# Patient Record
Sex: Male | Born: 1955 | Race: White | Hispanic: No | Marital: Married
Health system: Midwestern US, Academic
[De-identification: ages and names within clinical notes are randomized; demographics above are authoritative.]

---

## 2017-03-05 ENCOUNTER — Encounter: Admit: 2017-03-05 | Discharge: 2017-03-05 | Payer: 59

## 2017-03-05 MED ORDER — HYDROXYCHLOROQUINE 200 MG PO TAB
200 mg | ORAL_TABLET | Freq: Two times a day (BID) | ORAL | 1 refills | 90.00000 days | Status: DC
Start: 2017-03-05 — End: 2017-03-05

## 2017-03-05 MED ORDER — HYDROXYCHLOROQUINE 200 MG PO TAB
200 mg | ORAL_TABLET | Freq: Two times a day (BID) | ORAL | 1 refills | 90.00000 days | Status: DC
Start: 2017-03-05 — End: 2017-09-11

## 2017-04-16 ENCOUNTER — Ambulatory Visit: Admit: 2017-04-16 | Discharge: 2017-04-16 | Payer: 59

## 2017-04-16 ENCOUNTER — Encounter: Admit: 2017-04-16 | Discharge: 2017-04-16 | Payer: 59

## 2017-04-16 ENCOUNTER — Ambulatory Visit: Admit: 2017-04-16 | Discharge: 2017-04-17 | Payer: 59

## 2017-04-16 ENCOUNTER — Ambulatory Visit: Admit: 2017-04-16 | Discharge: 2017-04-16 | Payer: MEDICARE

## 2017-04-16 DIAGNOSIS — M797 Fibromyalgia: Principal | ICD-10-CM

## 2017-04-16 DIAGNOSIS — R200 Anesthesia of skin: ICD-10-CM

## 2017-04-16 DIAGNOSIS — M62838 Other muscle spasm: ICD-10-CM

## 2017-04-16 DIAGNOSIS — Z8719 Personal history of other diseases of the digestive system: ICD-10-CM

## 2017-04-16 DIAGNOSIS — K7469 Other cirrhosis of liver: ICD-10-CM

## 2017-04-16 DIAGNOSIS — K5792 Diverticulitis of intestine, part unspecified, without perforation or abscess without bleeding: ICD-10-CM

## 2017-04-16 DIAGNOSIS — M06079 Rheumatoid arthritis without rheumatoid factor, unspecified ankle and foot: Principal | ICD-10-CM

## 2017-04-16 DIAGNOSIS — M2550 Pain in unspecified joint: ICD-10-CM

## 2017-04-16 DIAGNOSIS — R202 Paresthesia of skin: ICD-10-CM

## 2017-04-16 DIAGNOSIS — R51 Headache: ICD-10-CM

## 2017-04-16 DIAGNOSIS — I10 Essential (primary) hypertension: ICD-10-CM

## 2017-04-16 LAB — 25-OH VITAMIN D (D2 + D3): Lab: 26 ng/mL — ABNORMAL LOW (ref 30–80)

## 2017-04-16 LAB — SED RATE: Lab: 2 mm/h (ref 0–20)

## 2017-04-16 LAB — C REACTIVE PROTEIN (CRP): Lab: 0 mg/dL (ref <1.0)

## 2017-04-16 LAB — CBC AND DIFF
Lab: 0 10*3/uL (ref 0–0.20)
Lab: 4.9 M/UL (ref 4.4–5.5)
Lab: 6.7 10*3/uL — ABNORMAL LOW (ref 4.5–11.0)

## 2017-04-16 LAB — COMPREHENSIVE METABOLIC PANEL
Lab: 0.5 mg/dL (ref 0.3–1.2)
Lab: 1 mg/dL — ABNORMAL HIGH (ref 0.4–1.24)
Lab: 112 U/L — ABNORMAL HIGH (ref >60)
Lab: 134 MMOL/L — ABNORMAL LOW (ref 137–147)
Lab: 37 U/L (ref 7–40)
Lab: 4.5 g/dL — ABNORMAL LOW (ref >60)
Lab: 4.8 MMOL/L (ref 3.5–5.1)
Lab: 43 U/L (ref 7–56)
Lab: 7.2 g/dL (ref 6.0–8.0)
Lab: 98 mg/dL (ref 70–100)
Lab: >60 mL/min (ref >60)

## 2017-04-16 LAB — IRON + BINDING CAPACITY + %SAT+ FERRITIN
Lab: 113 ug/dL (ref 50–185)
Lab: 208 ng/mL (ref 30–300)
Lab: 373 ug/dL (ref 270–380)

## 2017-04-16 LAB — VITAMIN B12: Lab: 419 pg/mL (ref 180–914)

## 2017-04-16 LAB — PROTIME INR (PT): Lab: 1 MMOL/L — ABNORMAL LOW (ref 0.8–1.2)

## 2017-04-16 LAB — URIC ACID: Lab: 4.1 mg/dL (ref 4.0–8.0)

## 2017-04-16 LAB — TSH WITH FREE T4 REFLEX: Lab: 2.2 uU/mL (ref 0.35–5.00)

## 2017-04-16 LAB — ALPHA FETO PROTEIN (AFP): Lab: 1.9 ng/mL — ABNORMAL LOW (ref 0.0–15.0)

## 2017-04-16 LAB — FOLATE, SERUM: Lab: 4.3 ng/mL (ref >3.9)

## 2017-04-16 LAB — PARATHYROID HORMONE: Lab: 48 pg/mL (ref 10–65)

## 2017-04-16 LAB — CCP IGG ANTIBODY

## 2017-04-17 LAB — ELECTROPHORESIS-SERUM PROTEIN
Lab: 11 % (ref 5–15)
Lab: 4.5 % (ref 2–6)
Lab: 6.7 g/dL (ref 6.0–8.0)
Lab: 60 % (ref 48–68)

## 2017-04-17 LAB — IMMUNOFIXATION, SERUM (IFES)

## 2017-04-17 LAB — ANTI-NUCLEAR ANTIBODY(ANA): Lab: <80 {titer} (ref <80)

## 2017-04-17 LAB — RHEUMATOID FACTOR (RF): Lab: <20 [IU]/mL (ref <24)

## 2017-04-19 ENCOUNTER — Encounter: Admit: 2017-04-19 | Discharge: 2017-04-19 | Payer: 59

## 2017-04-19 NOTE — Telephone Encounter
-----  Message from Reynolds. Selinda Eon, DO sent at 04/19/2017  9:02 AM CDT -----  Your labs and imaging have been reviewed.     1. The joint survey, which looked at various bones in your body, did not show any erosive changes. (Erosive changes can sometimes be seen in patients with RA). 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     2. All of you inflammatory markers, such as ESR and CRP were normal     3. Your rheumatoid markers were also normal.     4. The various vitamins and hormones we checked were all also normal, except for vitamin D.     5. You should start to take a daily Vit D3 supplement of 1,'000mg'$  daily.

## 2017-04-19 NOTE — Progress Notes
This patient had gastric sleeve in 2015. After repeat liver biopsy in 2017 Melissa advised that as long as he keeps his weight down he can have his PCP do enzymes yearly and only see us back if they rise or he gains weight.     Patients enzymes are up, please advise.  Shawn StallMichelle Briarrose Shor RN BSN

## 2017-04-19 NOTE — Telephone Encounter
Called pt and spoke to his wife. Pt had authorized us to discuss with her. Reviewed results and advised starting Vitamin D3 supplements of 1,000mg  daily. Wife verbalized understanding.

## 2017-04-27 ENCOUNTER — Encounter: Admit: 2017-04-27 | Discharge: 2017-04-27 | Payer: 59

## 2017-04-27 NOTE — Telephone Encounter
Called pt's wife Kendal HymenBonnie and went over Dr. Scarlette CalicoMoran's recommendations to hold off on methotrexate due to the labs not showing inflammation and to continue with Remicade as is. Can be re discussed at next visit. Kendal HymenBonnie verbalized understanding and denied questions at this time.

## 2017-04-27 NOTE — Telephone Encounter
Pt's wife called asking about initiating methotrexate. Wife stated it was discussed at last OV but that labs and imaging were required first. Labs and Joint Survery have finalized results that need review but the MRI isn't scheduled until 06/05/17. Wife also inquired about the possibility of having Remicade infusion dosage increased, also discussed at last OV.    Routing to Dr. Berneda RoseMoran for update.

## 2017-05-02 NOTE — Progress Notes
Benjamin Li, [name], Benjamin Li --> Benjamin Li, Benjamin Breisch, RN    We should see him and do a fibroscan.     Patient notified of lab results.   Patient verbalized understanding and scheduled for f/u and fibroscan.  Benjamin StallMichelle Gedalia Mcmillon RN BSN

## 2017-06-05 ENCOUNTER — Ambulatory Visit: Admit: 2017-06-05 | Discharge: 2017-06-05 | Payer: MEDICARE

## 2017-06-05 DIAGNOSIS — M06079 Rheumatoid arthritis without rheumatoid factor, unspecified ankle and foot: Principal | ICD-10-CM

## 2017-06-05 DIAGNOSIS — M2550 Pain in unspecified joint: ICD-10-CM

## 2017-06-05 LAB — POC CREATININE, RAD: Lab: 0.9 mg/dL (ref 0.4–1.24)

## 2017-06-05 MED ORDER — GADOBENATE DIMEGLUMINE 529 MG/ML (0.1MMOL/0.2ML) IV SOLN
16 mL | Freq: Once | INTRAVENOUS | 0 refills | Status: CP
Start: 2017-06-05 — End: ?
  Administered 2017-06-05: 15:00:00 16 mL via INTRAVENOUS

## 2017-06-11 ENCOUNTER — Encounter: Admit: 2017-06-11 | Discharge: 2017-06-11 | Payer: 59

## 2017-06-11 ENCOUNTER — Ambulatory Visit: Admit: 2017-06-11 | Discharge: 2017-06-12 | Payer: 59

## 2017-06-11 DIAGNOSIS — R51 Headache: ICD-10-CM

## 2017-06-11 DIAGNOSIS — K7581 Nonalcoholic steatohepatitis (NASH): Principal | ICD-10-CM

## 2017-06-11 DIAGNOSIS — M797 Fibromyalgia: Principal | ICD-10-CM

## 2017-06-11 DIAGNOSIS — K7469 Other cirrhosis of liver: ICD-10-CM

## 2017-06-11 DIAGNOSIS — I10 Essential (primary) hypertension: ICD-10-CM

## 2017-06-11 DIAGNOSIS — M62838 Other muscle spasm: ICD-10-CM

## 2017-06-11 DIAGNOSIS — K740 Hepatic fibrosis: ICD-10-CM

## 2017-06-11 DIAGNOSIS — K5792 Diverticulitis of intestine, part unspecified, without perforation or abscess without bleeding: ICD-10-CM

## 2017-06-11 NOTE — Procedures
Fibroscan Procedure Note    Date:  @DATE @  Operator:  Dorothey BasemanWinston Makel Mcmann, MD  Attending: Dorothey BasemanWinston Jenniferlynn Saad, MD    Observations  E (Elastacity) Median:  13.6  kPa  IQR / med.:  23%  CAP Median:  400 dB/m    Interpretation  Disease:  HBV/HCV/HCV-HIV/Cholestatic/NAFLD/Other  Fibrosis score correlation/Estimation:     HBV:    <6.0  F0-F1:  No to Minimal Fibrosis  6.0-8.9  F2:  Moderate Fibrosis  9.0-11.9 F3:  Advanced Fibrosis  >12.0  F4:  Cirrhosis    HCV  <7.0  F0-F1:  No to Minimal Fibrosis  7.0-9.4  F2:  Moderate Fibrosis  9.5-11.9 F3:  Advanced Fibrosis  >12  F4:  Cirrhosis    HCV-HIV  <7.0  F0-F1:  No to Minimal Fibrosis  7.0-10.9 (<10) F2:  Moderate Fibrosis  11.0-13.9 F3:  Advanced Fibrosis  >14  F4:  Cirrhosis    Cholestatic  <7.0  F0-F1:  No to Minimal Fibrosis  7.0-9.9 (>7.5) F2:  Moderate Fibrosis  10.0-16.9 F3:  Advanced Fibrosis  >17  F4:  Cirrhosis    NAFLD/NASH  <7.0  F0-F1:  No to Minimal Fibrosis  7.0-9.9 (>7.5) F2:  Moderate Fibrosis  10.0-13.9 F3:  Advanced Fibrosis  >14  F4:  Cirrhosis    Other  <6.0  F0-F1:  No to Minimal Fibrosis  6-8.9  F2:  Moderate Fibrosis  9.0-11.9 F3:  Advanced Fibrosis  >12  F4:  Cirrhosis      Steatosis score correlation/estimation:  238-258 S1: >11% Steatosis  259-289 S2: >34% Steatosis  >290  S3: >67% Steatosis      Recommendations  -Follow-up for patients with cirrhosis.     Fibroscan evaluations are estimates only and due to measurement variability/characteristics of elastography results should be interpreted with caution and clinical correlation is required.    I certify that I performed the interpretation of this study, including review of measurements and quality control measures.      Dorothey BasemanWinston Mazal Ebey, MD

## 2017-06-11 NOTE — Telephone Encounter
Call returned to patient wife.  Advised that the patient has possible cirrhosis and will require abdominal US every 6 months.  Patient wife would like to have this scheduled at [facility].  Shawn StallMichelle Neeti Knudtson Wnek RN BSN

## 2017-06-11 NOTE — Telephone Encounter
Please call pt's wife, Kendal HymenBonnie, as they have questions about today's office visit and diagnosis.

## 2017-06-11 NOTE — Progress Notes
Date of Service: 06/11/2017    Subjective:             Benjamin Li is a 61 y.o. male.    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  Patient has been doing well but continued to lose weight.  His liver enzyme was previously normal but has been elevated at this time.  Today we did a fibro-scan and it shows that his liver stiffness is 13.6 and his CAP score is 400.  My plan is to have him reengage with [facility] bariatric to discuss about the high protein diet.  We will have him take Glucerna 1 serving before bedtime.  I suspect that it is the excessive weight loss that results in the hepatic steatosis and worsening in liver fibrosis.  Will have labs every 3 months and follow-up with him in 6 months.     Review of Systems   Constitutional: Positive for chills, diaphoresis and fatigue.   Eyes: Positive for pain, redness and itching.   Gastrointestinal: Positive for abdominal pain and constipation.   Musculoskeletal: Positive for arthralgias, back pain, neck pain and neck stiffness.   Neurological: Positive for weakness, light-headedness, numbness and headaches.   All other systems reviewed and are negative.        Objective:         ??? amitriptyline (ELAVIL) 50 mg tablet TAKE 1 TAB BY MOUTH AT BEDTIME DAILY.   ??? fentaNYL (DURAGESIC) 50 mcg/hr patch Apply 1 Patch to top of skin as directed every 72 hours   ??? hydroxychloroquine (PLAQUENIL) 200 mg tablet Take 1 tablet by mouth twice daily. Take with food.   ??? losartan(+) (COZAAR) 100 mg tablet Take 50 mg by mouth daily.   ??? naloxegol (MOVANTIK) 12.5 mg tab Take 12.5 mg by mouth daily.   ??? omeprazole DR(+) (PRILOSEC) 20 mg capsule Take 20 mg by mouth daily.   ??? oxyCODONE SR (OXYCONTIN) 10 mg tablet Take 10 mg by mouth every 12 hours   ??? prednisone (DELTASONE) 5 mg tablet 15mg  daily for 1 days, 10mg  daily for 7 days, then 5mg  daily x 7 days ??? tiZANidine (ZANAFLEX) 4 mg tablet 2-4mg  in the evening PRN leg cramps   ??? traZODone (DESYREL) 100 mg tablet Take 200 mg by mouth at bedtime daily.     Vitals:    06/11/17 1014   BP: 131/84   Pulse: 82   Resp: 16   Temp: 36.8 ???C (98.2 ???F)   TempSrc: Oral   SpO2: 95%   Weight: 80.3 kg (177 lb)   Height: 177.8 cm (70)     Body mass index is 25.4 kg/m???.     Physical Exam         Assessment and Plan:  Patient has been doing well but continued to lose weight.  His liver enzyme was previously normal but has been elevated at this time.  Today we did a fibro-scan and it shows that his liver stiffness is 13.6 and his CAP score is 400.  My plan is to have him reengage with [facility] bariatric to discuss about the high protein diet.  We will have him take Glucerna 1 serving before bedtime.  I suspect that it is the excessive weight loss that results in the hepatic steatosis and worsening in liver fibrosis.  Will have labs every 3 months and follow-up with him in 6 months.

## 2017-06-12 ENCOUNTER — Encounter: Admit: 2017-06-12 | Discharge: 2017-06-12 | Payer: 59

## 2017-06-12 DIAGNOSIS — K740 Hepatic fibrosis: ICD-10-CM

## 2017-06-12 NOTE — Progress Notes
Called Scheduling at [facility] to set up US abdomen, no answer.  Faxed US order to [phone] for schedulers to set up.  We will follow up.

## 2017-06-13 ENCOUNTER — Encounter: Admit: 2017-06-13 | Discharge: 2017-06-13 | Payer: 59

## 2017-06-13 ENCOUNTER — Ambulatory Visit: Admit: 2017-06-13 | Discharge: 2017-06-14 | Payer: MEDICARE

## 2017-06-13 DIAGNOSIS — K5792 Diverticulitis of intestine, part unspecified, without perforation or abscess without bleeding: ICD-10-CM

## 2017-06-13 DIAGNOSIS — I10 Essential (primary) hypertension: ICD-10-CM

## 2017-06-13 DIAGNOSIS — R51 Headache: ICD-10-CM

## 2017-06-13 DIAGNOSIS — M797 Fibromyalgia: Principal | ICD-10-CM

## 2017-06-13 DIAGNOSIS — M62838 Other muscle spasm: ICD-10-CM

## 2017-06-14 DIAGNOSIS — M2550 Pain in unspecified joint: ICD-10-CM

## 2017-06-14 DIAGNOSIS — M0609 Rheumatoid arthritis without rheumatoid factor, multiple sites: Principal | ICD-10-CM

## 2017-06-14 DIAGNOSIS — K7469 Other cirrhosis of liver: ICD-10-CM

## 2017-06-14 DIAGNOSIS — M4726 Other spondylosis with radiculopathy, lumbar region: ICD-10-CM

## 2017-06-14 DIAGNOSIS — Z79899 Other long term (current) drug therapy: ICD-10-CM

## 2017-06-14 DIAGNOSIS — M47812 Spondylosis without myelopathy or radiculopathy, cervical region: Secondary | ICD-10-CM

## 2017-06-14 DIAGNOSIS — R200 Anesthesia of skin: ICD-10-CM

## 2017-06-14 DIAGNOSIS — K7581 Nonalcoholic steatohepatitis (NASH): ICD-10-CM

## 2017-06-15 NOTE — Progress Notes
Called Radiology at [facility], US done today 9/14.  Report isn't ready yet, will request next week.

## 2017-06-18 ENCOUNTER — Encounter: Admit: 2017-06-18 | Discharge: 2017-06-18 | Payer: 59

## 2017-06-18 NOTE — Telephone Encounter
Call received from patient wife, Benjamin Li, to discuss questions they have regarding new diagnosis of cirrhosis.  Advise Benjamin Li that cirrhosis is not a death sentence, that people can still live long lives with this diagnosis if they remain active and continue healthy living styles.   [name] of warning signs of decompensation and types/reason of monitoring.  Benjamin Li states that they were shocked with the news and are still working through it, advised Benjamin Li to call with any other questions or concerns.  Shawn Stall RN BSN

## 2017-06-19 ENCOUNTER — Encounter: Admit: 2017-06-19 | Discharge: 2017-06-19 | Payer: 59

## 2017-06-19 DIAGNOSIS — K740 Hepatic fibrosis: ICD-10-CM

## 2017-06-19 DIAGNOSIS — K7581 Nonalcoholic steatohepatitis (NASH): Principal | ICD-10-CM

## 2017-06-19 DIAGNOSIS — K7469 Other cirrhosis of liver: ICD-10-CM

## 2017-06-22 ENCOUNTER — Encounter: Admit: 2017-06-22 | Discharge: 2017-06-22 | Payer: 59

## 2017-06-22 NOTE — Telephone Encounter
Faxed Remicade infusion orders to [facility] at [phone] per representative at the hospital.

## 2017-06-25 ENCOUNTER — Encounter: Admit: 2017-06-25 | Discharge: 2017-06-25 | Payer: 59

## 2017-06-25 DIAGNOSIS — K7469 Other cirrhosis of liver: ICD-10-CM

## 2017-06-25 DIAGNOSIS — K7581 Nonalcoholic steatohepatitis (NASH): Principal | ICD-10-CM

## 2017-06-25 NOTE — Telephone Encounter
Plaquenil eye exam records received from [facility], exam done by Dr. Chilton Si. Pt was last seen 06/21/2017. Records normal. Records placed in [facility].

## 2017-06-25 NOTE — Telephone Encounter
Please call with recent ABD [place] results.

## 2017-06-26 NOTE — Progress Notes
Call returned to patient wife.  Patient wife notified of US results.   Patient wife verbalized understanding and would like repeat 6 month US completed at [facility].    RN BSN

## 2017-06-26 NOTE — Telephone Encounter
Call returned to patient wife.  Patient wife notified of [place] results.   Patient wife verbalized understanding and would like repeat 6 month [place] completed at [facility].  Shawn Stall RN BSN

## 2017-09-05 ENCOUNTER — Encounter: Admit: 2017-09-05 | Discharge: 2017-09-05 | Payer: 59

## 2017-09-05 NOTE — Telephone Encounter
US abdomen scheduled at [facility] on 12/03/17 at 0800.  Patient to npo after midnight. Order faxed to [phone].  Left appointment details on voicemail to notify patient.

## 2017-09-11 ENCOUNTER — Encounter: Admit: 2017-09-11 | Discharge: 2017-09-11 | Payer: 59

## 2017-09-11 ENCOUNTER — Ambulatory Visit: Admit: 2017-09-11 | Discharge: 2017-09-12 | Payer: MEDICARE

## 2017-09-11 DIAGNOSIS — I10 Essential (primary) hypertension: ICD-10-CM

## 2017-09-11 DIAGNOSIS — K5792 Diverticulitis of intestine, part unspecified, without perforation or abscess without bleeding: ICD-10-CM

## 2017-09-11 DIAGNOSIS — M797 Fibromyalgia: Principal | ICD-10-CM

## 2017-09-11 DIAGNOSIS — R51 Headache: ICD-10-CM

## 2017-09-11 DIAGNOSIS — M62838 Other muscle spasm: ICD-10-CM

## 2017-09-12 ENCOUNTER — Encounter: Admit: 2017-09-12 | Discharge: 2017-09-12 | Payer: 59

## 2017-09-12 DIAGNOSIS — M62838 Other muscle spasm: ICD-10-CM

## 2017-09-12 DIAGNOSIS — K7581 Nonalcoholic steatohepatitis (NASH): ICD-10-CM

## 2017-09-12 DIAGNOSIS — K5792 Diverticulitis of intestine, part unspecified, without perforation or abscess without bleeding: ICD-10-CM

## 2017-09-12 DIAGNOSIS — M797 Fibromyalgia: Principal | ICD-10-CM

## 2017-09-12 DIAGNOSIS — M0609 Rheumatoid arthritis without rheumatoid factor, multiple sites: Principal | ICD-10-CM

## 2017-09-12 DIAGNOSIS — M2550 Pain in unspecified joint: ICD-10-CM

## 2017-09-12 DIAGNOSIS — K7460 Unspecified cirrhosis of liver: Secondary | ICD-10-CM

## 2017-09-12 DIAGNOSIS — I10 Essential (primary) hypertension: ICD-10-CM

## 2017-09-12 DIAGNOSIS — R51 Headache: ICD-10-CM

## 2017-09-12 DIAGNOSIS — Z79899 Other long term (current) drug therapy: Secondary | ICD-10-CM

## 2017-09-12 NOTE — Telephone Encounter
Miranda, please fax the new order for: IV infliximab 10mg /kg q 4 weeks written on paper orders. Orders placed in your box. Pt requests that it is faxed to [facility]. Thank you.    Valetta Moleobyn , DO  Rheumatology fellow

## 2017-09-14 NOTE — Telephone Encounter
Faxed Remicade infusion orders to [facility] at [phone].

## 2017-09-17 ENCOUNTER — Encounter: Admit: 2017-09-17 | Discharge: 2017-09-17 | Payer: 59

## 2017-09-17 NOTE — Telephone Encounter
Pt's wife called asking about infusion orders. They were faxed on 09/14/17.    Called pt's wife and she wants them refaxed to a different number [phone]. Fax sent.

## 2017-10-08 ENCOUNTER — Encounter: Admit: 2017-10-08 | Discharge: 2017-10-08 | Payer: 59

## 2017-11-23 LAB — CBC AND DIFF

## 2017-11-23 LAB — COMPREHENSIVE METABOLIC PANEL

## 2017-12-04 ENCOUNTER — Encounter: Admit: 2017-12-04 | Discharge: 2017-12-04 | Payer: 59

## 2017-12-04 ENCOUNTER — Ambulatory Visit: Admit: 2017-12-04 | Discharge: 2017-12-05 | Payer: 59

## 2017-12-04 DIAGNOSIS — K5792 Diverticulitis of intestine, part unspecified, without perforation or abscess without bleeding: ICD-10-CM

## 2017-12-04 DIAGNOSIS — I10 Essential (primary) hypertension: ICD-10-CM

## 2017-12-04 DIAGNOSIS — M62838 Other muscle spasm: ICD-10-CM

## 2017-12-04 DIAGNOSIS — R51 Headache: ICD-10-CM

## 2017-12-04 DIAGNOSIS — M797 Fibromyalgia: Principal | ICD-10-CM

## 2017-12-05 ENCOUNTER — Encounter: Admit: 2017-12-05 | Discharge: 2017-12-05 | Payer: 59

## 2017-12-05 DIAGNOSIS — M0609 Rheumatoid arthritis without rheumatoid factor, multiple sites: Principal | ICD-10-CM

## 2017-12-05 DIAGNOSIS — Z79899 Other long term (current) drug therapy: ICD-10-CM

## 2017-12-05 DIAGNOSIS — K7581 Nonalcoholic steatohepatitis (NASH): Secondary | ICD-10-CM

## 2017-12-05 DIAGNOSIS — M47812 Spondylosis without myelopathy or radiculopathy, cervical region: ICD-10-CM

## 2017-12-05 DIAGNOSIS — M542 Cervicalgia: ICD-10-CM

## 2017-12-05 DIAGNOSIS — M797 Fibromyalgia: ICD-10-CM

## 2017-12-05 DIAGNOSIS — K7460 Unspecified cirrhosis of liver: ICD-10-CM

## 2017-12-05 DIAGNOSIS — M4722 Other spondylosis with radiculopathy, cervical region: ICD-10-CM

## 2017-12-05 DIAGNOSIS — M2550 Pain in unspecified joint: ICD-10-CM

## 2017-12-12 ENCOUNTER — Encounter: Admit: 2017-12-12 | Discharge: 2017-12-12 | Payer: 59

## 2017-12-12 DIAGNOSIS — K740 Hepatic fibrosis: ICD-10-CM

## 2017-12-12 DIAGNOSIS — K7581 Nonalcoholic steatohepatitis (NASH): Principal | ICD-10-CM

## 2017-12-21 LAB — COMPREHENSIVE METABOLIC PANEL

## 2017-12-21 LAB — CBC AND DIFF

## 2018-01-03 ENCOUNTER — Encounter: Admit: 2018-01-03 | Discharge: 2018-01-04 | Payer: 59

## 2018-01-03 ENCOUNTER — Encounter: Admit: 2018-01-03 | Discharge: 2018-01-03 | Payer: 59

## 2018-01-03 DIAGNOSIS — M62838 Other muscle spasm: ICD-10-CM

## 2018-01-03 DIAGNOSIS — I10 Essential (primary) hypertension: ICD-10-CM

## 2018-01-03 DIAGNOSIS — R51 Headache: ICD-10-CM

## 2018-01-03 DIAGNOSIS — M797 Fibromyalgia: Principal | ICD-10-CM

## 2018-01-03 DIAGNOSIS — K5792 Diverticulitis of intestine, part unspecified, without perforation or abscess without bleeding: ICD-10-CM

## 2018-01-04 ENCOUNTER — Encounter: Admit: 2018-01-04 | Discharge: 2018-01-04 | Payer: 59

## 2018-01-07 ENCOUNTER — Encounter: Admit: 2018-01-07 | Discharge: 2018-01-07 | Payer: 59

## 2018-01-07 DIAGNOSIS — K7581 Nonalcoholic steatohepatitis (NASH): Principal | ICD-10-CM

## 2018-01-07 DIAGNOSIS — K740 Hepatic fibrosis: ICD-10-CM

## 2018-01-08 ENCOUNTER — Encounter: Admit: 2018-01-08 | Discharge: 2018-01-08 | Payer: 59

## 2018-01-22 ENCOUNTER — Encounter: Admit: 2018-01-22 | Discharge: 2018-01-22 | Payer: 59

## 2018-01-22 DIAGNOSIS — I10 Essential (primary) hypertension: ICD-10-CM

## 2018-01-22 DIAGNOSIS — M797 Fibromyalgia: Principal | ICD-10-CM

## 2018-01-22 DIAGNOSIS — M4722 Other spondylosis with radiculopathy, cervical region: Secondary | ICD-10-CM

## 2018-01-22 DIAGNOSIS — R51 Headache: ICD-10-CM

## 2018-01-22 DIAGNOSIS — K5792 Diverticulitis of intestine, part unspecified, without perforation or abscess without bleeding: ICD-10-CM

## 2018-01-22 DIAGNOSIS — M62838 Other muscle spasm: ICD-10-CM

## 2018-01-22 MED ORDER — DICLOFENAC SODIUM 1 % TP GEL
2 g | Freq: Four times a day (QID) | TOPICAL | 3 refills | 19.00000 days | Status: AC
Start: 2018-01-22 — End: ?

## 2018-01-23 ENCOUNTER — Ambulatory Visit: Admit: 2018-01-22 | Discharge: 2018-01-23 | Payer: MEDICARE

## 2018-01-23 DIAGNOSIS — M797 Fibromyalgia: ICD-10-CM

## 2018-01-23 DIAGNOSIS — M4802 Spinal stenosis, cervical region: ICD-10-CM

## 2018-01-23 DIAGNOSIS — K7581 Nonalcoholic steatohepatitis (NASH): ICD-10-CM

## 2018-01-23 DIAGNOSIS — M0609 Rheumatoid arthritis without rheumatoid factor, multiple sites: ICD-10-CM

## 2018-01-23 DIAGNOSIS — M542 Cervicalgia: Principal | ICD-10-CM

## 2018-01-23 DIAGNOSIS — M5030 Other cervical disc degeneration, unspecified cervical region: ICD-10-CM

## 2018-01-23 DIAGNOSIS — M2550 Pain in unspecified joint: ICD-10-CM

## 2018-01-23 DIAGNOSIS — M47812 Spondylosis without myelopathy or radiculopathy, cervical region: ICD-10-CM

## 2018-01-23 DIAGNOSIS — Z79899 Other long term (current) drug therapy: ICD-10-CM

## 2018-01-23 DIAGNOSIS — K7460 Unspecified cirrhosis of liver: ICD-10-CM

## 2018-02-08 ENCOUNTER — Encounter: Admit: 2018-02-08 | Discharge: 2018-02-08 | Payer: 59

## 2018-02-19 ENCOUNTER — Encounter: Admit: 2018-02-19 | Discharge: 2018-02-19 | Payer: 59

## 2018-02-27 ENCOUNTER — Encounter: Admit: 2018-02-27 | Discharge: 2018-02-27 | Payer: 59

## 2018-07-01 ENCOUNTER — Ambulatory Visit: Admit: 2018-07-01 | Discharge: 2018-07-02 | Payer: 59

## 2018-07-01 ENCOUNTER — Encounter: Admit: 2018-07-01 | Discharge: 2018-07-01 | Payer: 59

## 2018-07-01 DIAGNOSIS — M797 Fibromyalgia: Principal | ICD-10-CM

## 2018-07-01 DIAGNOSIS — Z79899 Other long term (current) drug therapy: ICD-10-CM

## 2018-07-01 DIAGNOSIS — K5792 Diverticulitis of intestine, part unspecified, without perforation or abscess without bleeding: ICD-10-CM

## 2018-07-01 DIAGNOSIS — G7119 Other specified myotonic disorders: ICD-10-CM

## 2018-07-01 DIAGNOSIS — R51 Headache: ICD-10-CM

## 2018-07-01 DIAGNOSIS — I10 Essential (primary) hypertension: ICD-10-CM

## 2018-07-01 DIAGNOSIS — M0609 Rheumatoid arthritis without rheumatoid factor, multiple sites: Principal | ICD-10-CM

## 2018-07-01 DIAGNOSIS — K7460 Unspecified cirrhosis of liver: ICD-10-CM

## 2018-07-01 DIAGNOSIS — M47812 Spondylosis without myelopathy or radiculopathy, cervical region: ICD-10-CM

## 2018-07-01 DIAGNOSIS — M2550 Pain in unspecified joint: ICD-10-CM

## 2018-07-01 DIAGNOSIS — M5412 Radiculopathy, cervical region: ICD-10-CM

## 2018-07-01 DIAGNOSIS — M62838 Other muscle spasm: ICD-10-CM

## 2018-07-05 ENCOUNTER — Encounter: Admit: 2018-07-05 | Discharge: 2018-07-05 | Payer: 59

## 2018-07-05 DIAGNOSIS — M797 Fibromyalgia: Principal | ICD-10-CM

## 2018-07-05 DIAGNOSIS — M62838 Other muscle spasm: ICD-10-CM

## 2018-07-05 DIAGNOSIS — R51 Headache: ICD-10-CM

## 2018-07-05 DIAGNOSIS — I10 Essential (primary) hypertension: ICD-10-CM

## 2018-07-05 DIAGNOSIS — K5792 Diverticulitis of intestine, part unspecified, without perforation or abscess without bleeding: ICD-10-CM

## 2018-07-15 ENCOUNTER — Encounter: Admit: 2018-07-15 | Discharge: 2018-07-15 | Payer: 59

## 2018-07-16 ENCOUNTER — Encounter: Admit: 2018-07-16 | Discharge: 2018-07-16 | Payer: 59

## 2018-11-11 ENCOUNTER — Encounter: Admit: 2018-11-11 | Discharge: 2018-11-11 | Payer: 59

## 2018-11-11 ENCOUNTER — Ambulatory Visit: Admit: 2018-11-11 | Discharge: 2018-11-12 | Payer: MEDICARE

## 2018-11-11 DIAGNOSIS — M797 Fibromyalgia: Secondary | ICD-10-CM

## 2018-11-11 DIAGNOSIS — G7119 Other specified myotonic disorders: Secondary | ICD-10-CM

## 2018-11-11 DIAGNOSIS — K5792 Diverticulitis of intestine, part unspecified, without perforation or abscess without bleeding: ICD-10-CM

## 2018-11-11 DIAGNOSIS — R51 Headache: ICD-10-CM

## 2018-11-11 DIAGNOSIS — M0600 Rheumatoid arthritis without rheumatoid factor, unspecified site: Principal | ICD-10-CM

## 2018-11-11 DIAGNOSIS — M62838 Other muscle spasm: ICD-10-CM

## 2018-11-11 DIAGNOSIS — I10 Essential (primary) hypertension: ICD-10-CM

## 2018-11-11 NOTE — Patient Instructions
In some patients, this medicine may cause a serious brain infection that may cause death. If you have any problems seeing, thinking, speaking, walking, or standing, tell your healthcare professional right away. If you cannot reach your healthcare professional, urgently seek other source of medical care.  Call your doctor or health care professional for advice if you get a fever, chills or sore throat, or other symptoms of a cold or flu. Do not treat yourself. This drug decreases your body's ability to fight infections. Try to avoid being around people who are sick.  Do not become pregnant while taking this medicine or for 12 months after stopping it. Women should inform their doctor if they wish to become pregnant or think they might be pregnant. There is a potential for serious side effects to an unborn child. Talk to your health care professional or pharmacist for more information. Do not breast-feed an infant while taking this medicine or for 6 months after stopping it.  NOTE:This sheet is a summary. It may not cover all possible information. If you have questions about this medicine, talk to your doctor, pharmacist, or health care provider. Copyright??? 2019 [employer]

## 2018-11-12 ENCOUNTER — Encounter: Admit: 2018-11-12 | Discharge: 2018-11-12 | Payer: 59

## 2018-11-12 DIAGNOSIS — Z79899 Other long term (current) drug therapy: ICD-10-CM

## 2018-11-12 DIAGNOSIS — M2550 Pain in unspecified joint: Secondary | ICD-10-CM

## 2018-11-12 DIAGNOSIS — K7460 Unspecified cirrhosis of liver: ICD-10-CM

## 2018-11-12 DIAGNOSIS — M0609 Rheumatoid arthritis without rheumatoid factor, multiple sites: Principal | ICD-10-CM

## 2018-11-15 LAB — CBC AND DIFF

## 2018-11-20 ENCOUNTER — Encounter: Admit: 2018-11-20 | Discharge: 2018-11-20 | Payer: 59

## 2018-11-28 ENCOUNTER — Encounter: Admit: 2018-11-28 | Discharge: 2018-11-28 | Payer: 59

## 2018-11-28 NOTE — Telephone Encounter
Rec'd fax from Ortho. Routing to Dr. Berneda Rose to review and make recommendations. Sounds like we can write a letter of clearance and give them recommendations for the medications.

## 2018-12-10 NOTE — Telephone Encounter
Michelle with [facility] called and lvm that she would like [place] to refax the orders for Rituxan to [phone].    Faxing orders to [phone].     Called Michelle at [phone] and lvm on infusion vm that we have faxed the orders and to call back with any questions.

## 2018-12-30 LAB — COMPREHENSIVE METABOLIC PANEL

## 2018-12-30 LAB — CBC AND DIFF

## 2019-02-27 ENCOUNTER — Encounter: Admit: 2019-02-27 | Discharge: 2019-02-27 | Payer: 59

## 2019-04-30 ENCOUNTER — Encounter: Admit: 2019-04-30 | Discharge: 2019-04-30

## 2019-04-30 ENCOUNTER — Ambulatory Visit: Admit: 2019-04-30 | Discharge: 2019-05-01

## 2019-04-30 DIAGNOSIS — I10 Essential (primary) hypertension: Secondary | ICD-10-CM

## 2019-04-30 DIAGNOSIS — M62838 Other muscle spasm: Secondary | ICD-10-CM

## 2019-04-30 DIAGNOSIS — D899 Disorder involving the immune mechanism, unspecified: Secondary | ICD-10-CM

## 2019-04-30 DIAGNOSIS — M47812 Spondylosis without myelopathy or radiculopathy, cervical region: Secondary | ICD-10-CM

## 2019-04-30 DIAGNOSIS — M0600 Rheumatoid arthritis without rheumatoid factor, unspecified site: Principal | ICD-10-CM

## 2019-04-30 DIAGNOSIS — K7460 Unspecified cirrhosis of liver: Secondary | ICD-10-CM

## 2019-04-30 DIAGNOSIS — K5792 Diverticulitis of intestine, part unspecified, without perforation or abscess without bleeding: Secondary | ICD-10-CM

## 2019-04-30 DIAGNOSIS — R51 Headache: Secondary | ICD-10-CM

## 2019-04-30 DIAGNOSIS — M5137 Other intervertebral disc degeneration, lumbosacral region: Secondary | ICD-10-CM

## 2019-04-30 DIAGNOSIS — Z5181 Encounter for therapeutic drug level monitoring: Secondary | ICD-10-CM

## 2019-04-30 DIAGNOSIS — M797 Fibromyalgia: Secondary | ICD-10-CM

## 2019-05-01 ENCOUNTER — Encounter: Admit: 2019-05-01 | Discharge: 2019-05-01

## 2019-05-01 DIAGNOSIS — G7119 Other specified myotonic disorders: Secondary | ICD-10-CM

## 2019-05-01 DIAGNOSIS — M2550 Pain in unspecified joint: Secondary | ICD-10-CM

## 2019-05-01 DIAGNOSIS — Z79899 Other long term (current) drug therapy: Secondary | ICD-10-CM

## 2019-05-01 DIAGNOSIS — M0600 Rheumatoid arthritis without rheumatoid factor, unspecified site: Secondary | ICD-10-CM

## 2019-05-01 NOTE — Telephone Encounter
Per Dr. Constance Holster to add on labs to Rituxan infusion orders (ESR, CRP and IgG level).    Infusion orders completed with added labs. Will fax orders to [facility] at [phone] once reviewed and signed off by Dr. Constance Holster.

## 2019-05-02 NOTE — Telephone Encounter
Faxing updated infusion orders via epic to [facility] at [phone].

## 2019-05-02 NOTE — Telephone Encounter
Rituxan infusion order signed.

## 2019-05-15 NOTE — Telephone Encounter
[facility] at [phone] and they did receive the orders faxed for Rituxan.     Pt is due on 09/16 for next infusion if they need anything else from [place] they will call us.

## 2019-05-16 NOTE — Telephone Encounter
[name] with [facility] returned call and lvm that they have the orders and everything they need. Benjamin Li states the pt is scheduled for next infusion on 09/16 and 09/30. Benjamin Li had no further questions at this time.

## 2019-05-16 NOTE — Telephone Encounter
[name] with [facility] infusion called and lvm to return call at [phone]. Called [place] and lvm for a return call at the number she provided.

## 2019-06-11 ENCOUNTER — Encounter: Admit: 2019-06-11 | Discharge: 2019-06-11

## 2019-06-11 NOTE — Telephone Encounter
Benjamin Li with [facility] infusion called and lvm that there is an issue with the pts infusion. Called Benjamin at [phone] and lvm for a return call to discuss concerns.

## 2019-06-13 NOTE — Telephone Encounter
Benjamin Li with [facility] infusion center returned call and has some concerns she would like to discuss regarding the pts upcoming Rituxan next week. Called Benjamin at [phone] and she states that they are not longer allowed to administer the Rituxan at their facility due to not having a chemo hood to be able to mix the medication properly. She states the pt will need to be rescheduled to have this done at an alternative infusion unit. Benjamin also stated she would like to know if this RN could call and communicate with Dr. Rolly Salter (PCP) nurse Azucena Kuba as she has also been working on this as well. [name] at [phone] and notified her that we will bring the pt back to [place] for the infusions and will notify the pt. [name] states understanding.    Called pt and lvm for a return call to discuss the need to come back to [place] for infusions.     Routing to Dr. Constance Holster to review and reorder Rituxan at [facility] so infusion team can start working on pre cert and coding to get him scheduled. Not sure if we should mark the referral as urgent since the pt was due on 09/16 for his infusion.

## 2019-06-15 MED ORDER — ACETAMINOPHEN 500 MG PO TAB
500 mg | Freq: Once | ORAL | 0 refills | Status: CN
Start: 2019-06-15 — End: ?

## 2019-06-15 MED ORDER — RITUXIMAB IVPB
1000 mg | Freq: Once | INTRAVENOUS | 0 refills | Status: CN
Start: 2019-06-15 — End: ?

## 2019-06-15 MED ORDER — METHYLPREDNISOLONE SOD SUC(PF) 125 MG/2 ML IJ SOLR
125 mg | Freq: Once | INTRAVENOUS | 0 refills | Status: CN
Start: 2019-06-15 — End: ?

## 2019-06-15 MED ORDER — DIPHENHYDRAMINE HCL 25 MG PO CAP
25 mg | Freq: Once | ORAL | 0 refills | Status: CN
Start: 2019-06-15 — End: ?

## 2019-06-15 NOTE — Telephone Encounter
Mr Jachim Rituximab infusions has been reordered to be done at the [place] infusion center.    Thanks  Dr Constance Holster

## 2019-07-08 LAB — HEPATITIS B SURFACE AG

## 2019-07-15 ENCOUNTER — Encounter: Admit: 2019-07-15 | Discharge: 2019-07-15 | Payer: MEDICARE

## 2019-07-15 DIAGNOSIS — Z5181 Encounter for therapeutic drug level monitoring: Secondary | ICD-10-CM

## 2019-07-15 DIAGNOSIS — M0600 Rheumatoid arthritis without rheumatoid factor, unspecified site: Secondary | ICD-10-CM

## 2019-07-15 NOTE — Progress Notes
Your kidney function and liver function are normal. Blood counts are stable. Inflammatory markers normal. Hepatitis testing was negative. Please continue rituximab therapy  Dr Constance Holster

## 2019-07-29 ENCOUNTER — Encounter: Admit: 2019-07-29 | Discharge: 2019-07-29 | Payer: MEDICARE

## 2019-10-16 IMAGING — CR CHEST
2 series · 2 of 2 positions shown · non-contrast
Comparison: none

[chest pa]
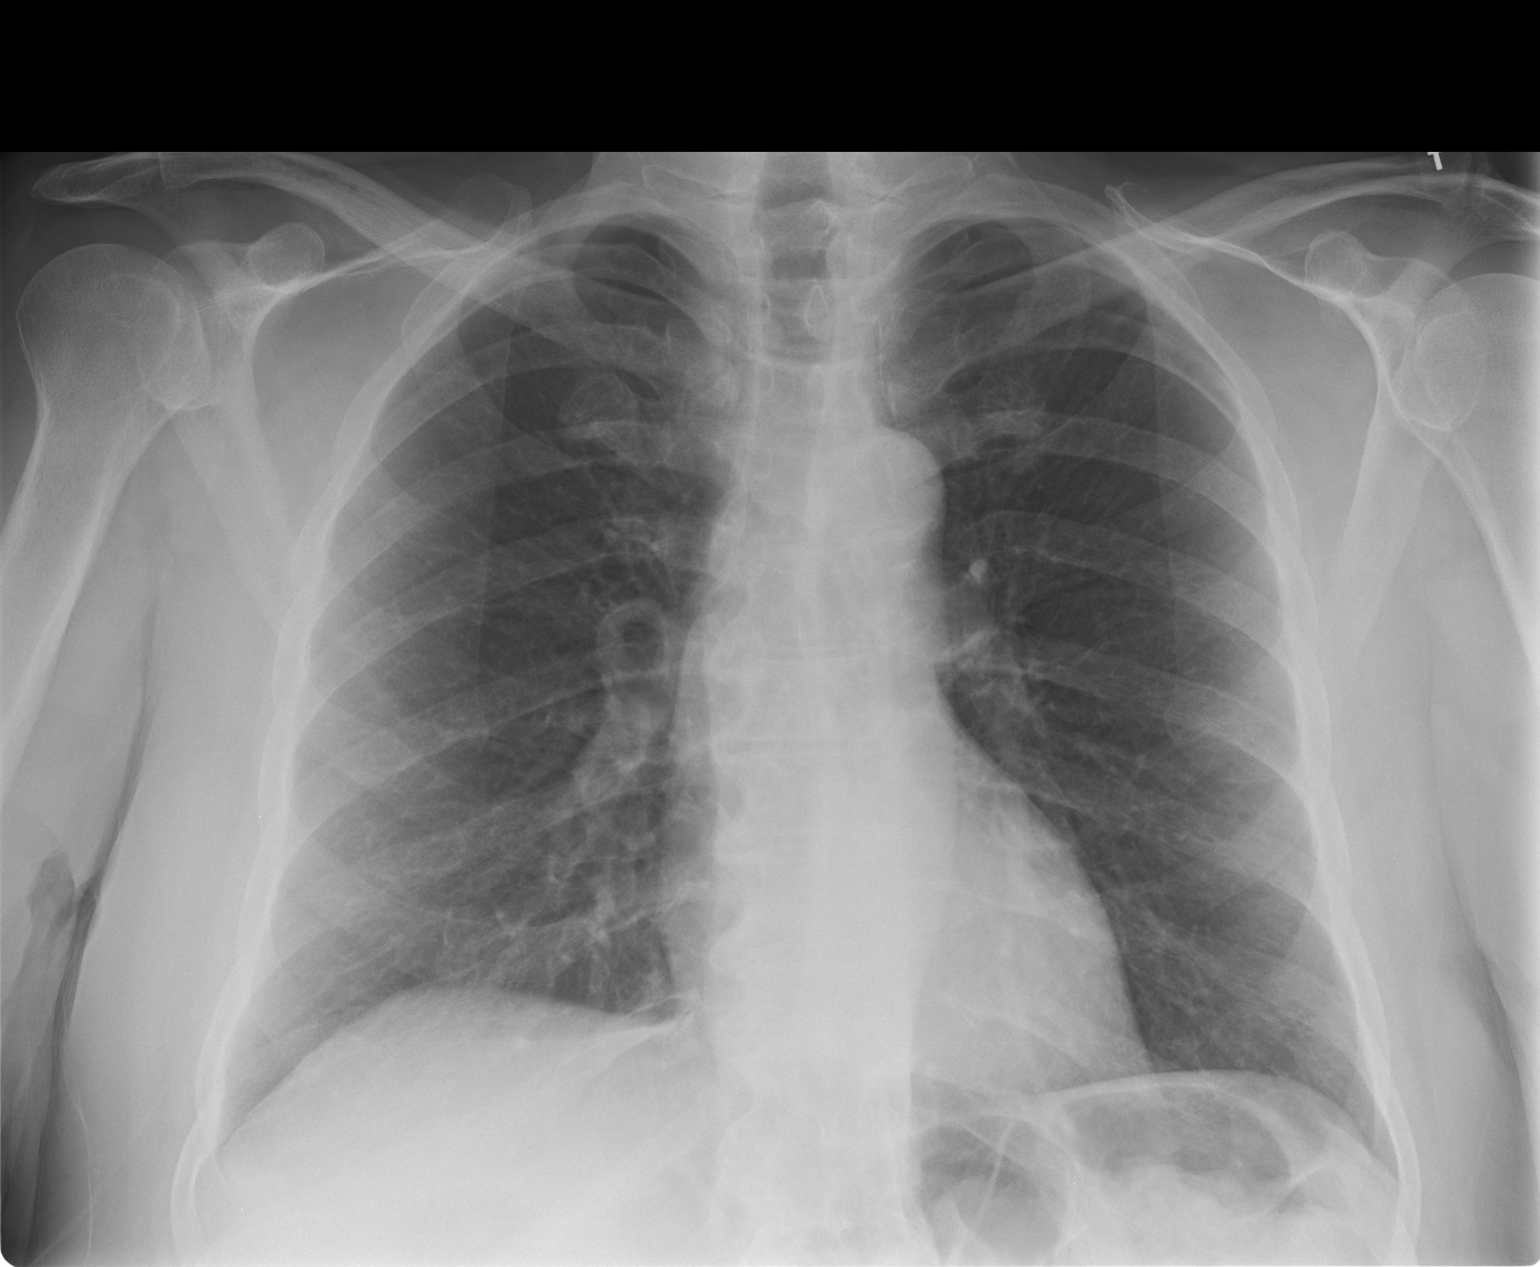

[chest lat]
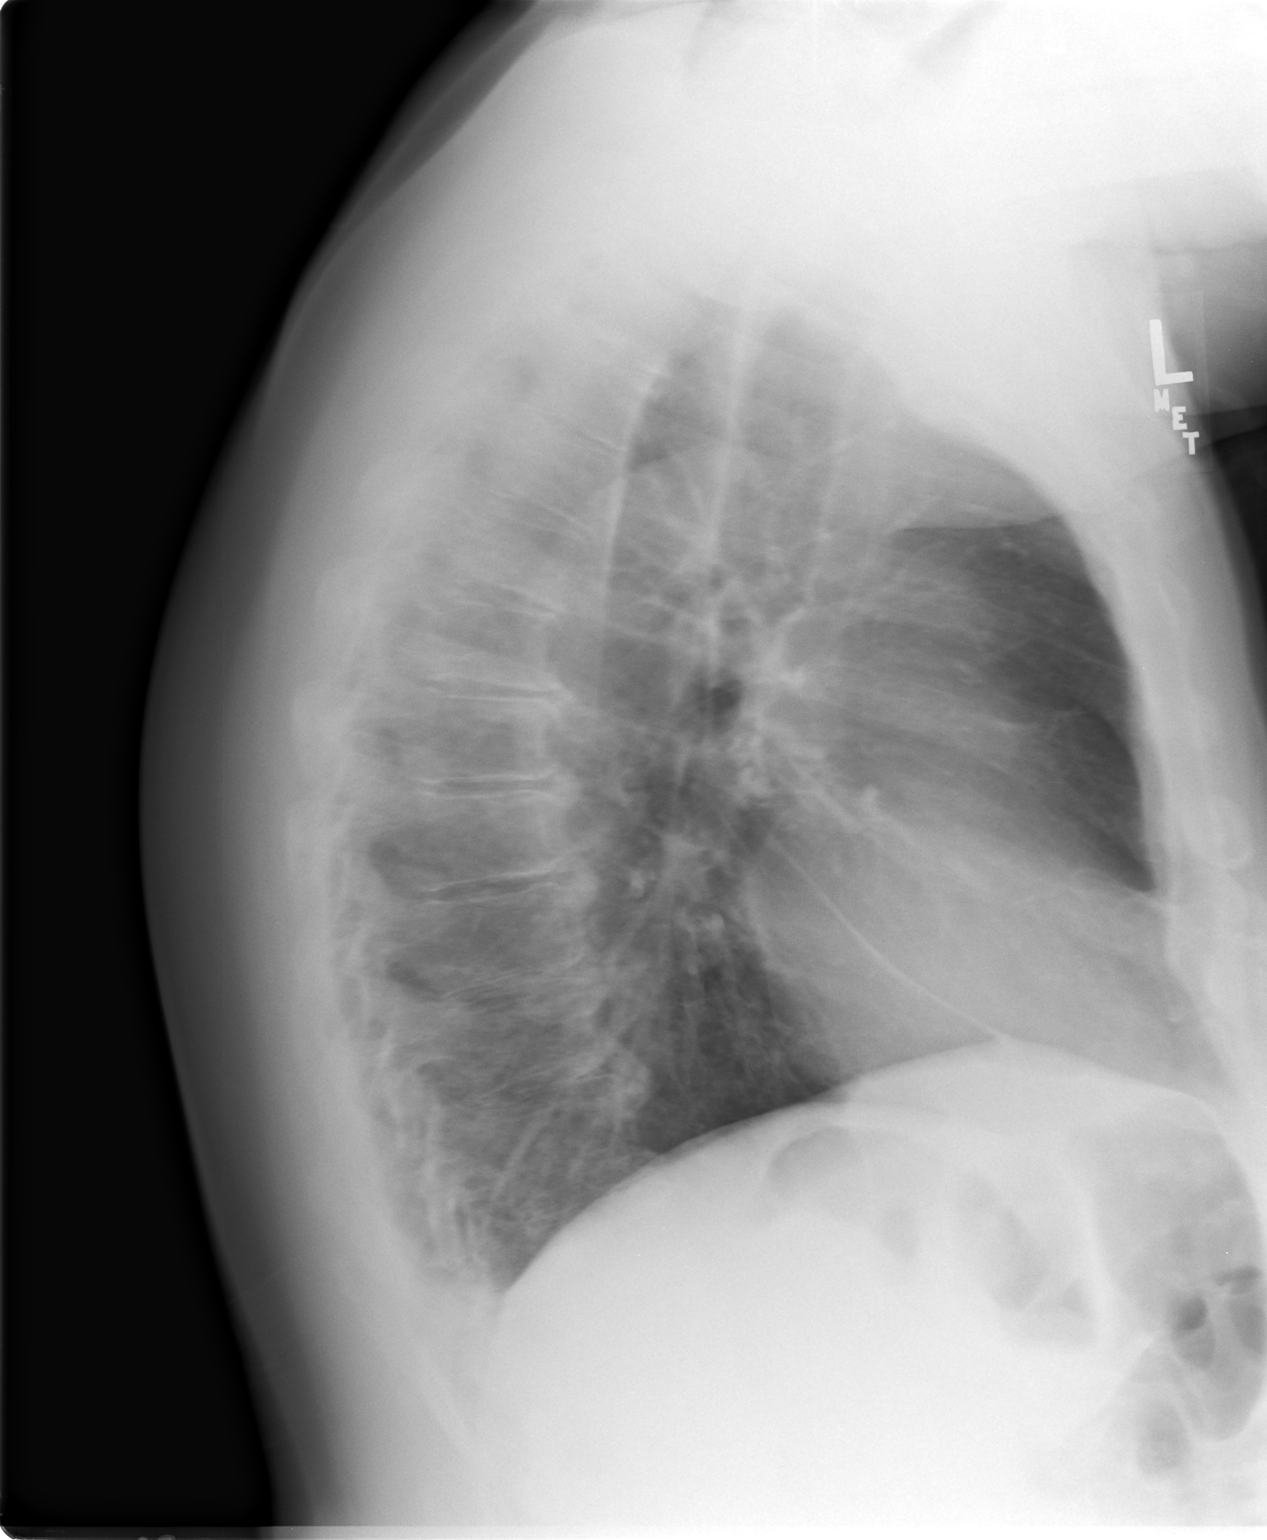

[2 of 2 positions shown; findings below may reference images not displayed]

DIAGNOSTIC STUDIES

EXAM

Chest radiographs.

INDICATION

fever, cough, R flank/abd pain, pleuritic pain
PT. STATES COUGH, MUCOUS X GREEN/YELLOW , FEVER.MT

TECHNIQUE

PA and lateral chest views.

COMPARISONS

None.

FINDINGS

The lungs are clear without consolidative airspace disease, pleural effusion, or pneumothorax. The
cardiomediastinal silhouette is not widened. Thoracic spondylosis is present.

IMPRESSION

No acute cardiopulmonary pathology.

## 2019-11-04 ENCOUNTER — Encounter: Admit: 2019-11-04 | Discharge: 2019-11-04 | Payer: MEDICARE

## 2019-12-15 ENCOUNTER — Encounter: Admit: 2019-12-15 | Discharge: 2019-12-15 | Payer: MEDICARE

## 2019-12-15 ENCOUNTER — Ambulatory Visit: Admit: 2019-12-15 | Discharge: 2019-12-15 | Payer: MEDICARE

## 2019-12-15 DIAGNOSIS — M62838 Other muscle spasm: Secondary | ICD-10-CM

## 2019-12-15 DIAGNOSIS — K7460 Unspecified cirrhosis of liver: Secondary | ICD-10-CM

## 2019-12-15 DIAGNOSIS — M0600 Rheumatoid arthritis without rheumatoid factor, unspecified site: Secondary | ICD-10-CM

## 2019-12-15 DIAGNOSIS — M797 Fibromyalgia: Secondary | ICD-10-CM

## 2019-12-15 DIAGNOSIS — K5792 Diverticulitis of intestine, part unspecified, without perforation or abscess without bleeding: Secondary | ICD-10-CM

## 2019-12-15 DIAGNOSIS — I10 Essential (primary) hypertension: Secondary | ICD-10-CM

## 2019-12-15 DIAGNOSIS — D849 Immunodeficiency, unspecified: Secondary | ICD-10-CM

## 2019-12-15 DIAGNOSIS — M2550 Pain in unspecified joint: Secondary | ICD-10-CM

## 2019-12-15 DIAGNOSIS — Z5181 Encounter for therapeutic drug level monitoring: Secondary | ICD-10-CM

## 2019-12-15 DIAGNOSIS — R519 Generalized headaches: Secondary | ICD-10-CM

## 2019-12-15 LAB — CBC AND DIFF
Lab: 0.1 10*3/uL (ref 0–0.20)
Lab: 0.1 10*3/uL (ref 0–0.45)
Lab: 0.4 10*3/uL (ref 0–0.80)
Lab: 1.5 10*3/uL (ref 1.0–4.8)
Lab: 13 % (ref 11–15)
Lab: 14 g/dL (ref 13.5–16.5)
Lab: 2 % (ref >60)
Lab: 23 % — ABNORMAL LOW (ref 24–44)
Lab: 246 10*3/uL (ref 150–400)
Lab: 28 pg (ref 26–34)
Lab: 3 % (ref >60)
Lab: 4.4 10*3/uL (ref 1.8–7.0)
Lab: 4.9 M/UL — ABNORMAL HIGH (ref 4.4–5.5)
Lab: 41 % (ref 40–50)
Lab: 6.8 K/UL (ref 4.5–11.0)
Lab: 65 % — ABNORMAL HIGH (ref 41–77)
Lab: 7 % (ref 4–12)

## 2019-12-15 LAB — SED RATE: Lab: 2 mm/h (ref 0–20)

## 2019-12-15 LAB — HEPATITIS C ANTIBODY W REFLEX HCV PCR QUANT

## 2019-12-15 LAB — C REACTIVE PROTEIN (CRP): Lab: 0.1 mg/dL (ref <1.0)

## 2019-12-15 LAB — COMPREHENSIVE METABOLIC PANEL
Lab: 138 MMOL/L (ref 137–147)
Lab: 4.6 MMOL/L (ref 3.5–5.1)

## 2019-12-15 LAB — HEPATITIS B SURFACE AB: Lab: NEGATIVE % — ABNORMAL LOW (ref 24–44)

## 2019-12-15 MED ORDER — RXAMB AMITR/GABAPEN/EMU OIL 4/4/10% CREAM (COMPOUND)
Freq: Three times a day (TID) | TOPICAL | 1 refills | Status: CN | PRN
Start: 2019-12-15 — End: ?
  Filled 2019-12-15: qty 300, 15d supply, fill #1

## 2019-12-16 ENCOUNTER — Encounter: Admit: 2019-12-16 | Discharge: 2019-12-16 | Payer: MEDICARE

## 2019-12-16 NOTE — Progress Notes
Hepatitis C antibodies are negative. You do not have immunity to hepatitis B. Please ask your primary doctor or the health department regarding hepatitis B vaccination. Inflammatory markers are not elevated. Kidney and liver function is normal. Blood counts are stable. Please let me know if there are any questions.   Dr Pollyann Kennedy

## 2019-12-17 ENCOUNTER — Encounter: Admit: 2019-12-17 | Discharge: 2019-12-17 | Payer: MEDICARE

## 2020-01-07 ENCOUNTER — Encounter: Admit: 2020-01-07 | Discharge: 2020-01-07 | Payer: MEDICARE

## 2020-05-10 ENCOUNTER — Ambulatory Visit: Admit: 2020-05-10 | Discharge: 2020-05-10 | Payer: MEDICARE

## 2020-05-10 ENCOUNTER — Encounter: Admit: 2020-05-10 | Discharge: 2020-05-10 | Payer: MEDICARE

## 2020-05-10 DIAGNOSIS — Z5181 Encounter for therapeutic drug level monitoring: Secondary | ICD-10-CM

## 2020-05-10 DIAGNOSIS — M0600 Rheumatoid arthritis without rheumatoid factor, unspecified site: Secondary | ICD-10-CM

## 2020-05-10 DIAGNOSIS — M62838 Other muscle spasm: Secondary | ICD-10-CM

## 2020-05-10 DIAGNOSIS — D849 Immunodeficiency, unspecified: Secondary | ICD-10-CM

## 2020-05-10 DIAGNOSIS — M797 Fibromyalgia: Secondary | ICD-10-CM

## 2020-05-10 DIAGNOSIS — K7460 Unspecified cirrhosis of liver: Secondary | ICD-10-CM

## 2020-05-10 DIAGNOSIS — I10 Essential (primary) hypertension: Secondary | ICD-10-CM

## 2020-05-10 DIAGNOSIS — K5792 Diverticulitis of intestine, part unspecified, without perforation or abscess without bleeding: Secondary | ICD-10-CM

## 2020-05-10 DIAGNOSIS — R519 Generalized headaches: Secondary | ICD-10-CM

## 2020-05-10 DIAGNOSIS — G7119 Other specified myotonic disorders: Secondary | ICD-10-CM

## 2020-05-10 DIAGNOSIS — M2550 Pain in unspecified joint: Secondary | ICD-10-CM

## 2020-05-10 LAB — COMPREHENSIVE METABOLIC PANEL
Lab: 102 mg/dL — ABNORMAL HIGH (ref 70–100)
Lab: 103 MMOL/L (ref 98–110)
Lab: 140 MMOL/L (ref 137–147)
Lab: 15 mg/dL (ref 7–25)
Lab: 4.6 MMOL/L (ref 3.5–5.1)
Lab: 9.5 mg/dL (ref 8.5–10.6)

## 2020-05-10 LAB — IMMUNOGLOBULINS-IGA,IGG,IGM
Lab: 295 mg/dL (ref 70–390)
Lab: 49 mg/dL (ref 38–328)
Lab: 743 mg/dL — ABNORMAL LOW (ref 762–1488)

## 2020-05-10 LAB — CBC
Lab: 12 g/dL — ABNORMAL LOW (ref >60)
Lab: 14 % (ref 11–15)
Lab: 232 K/UL (ref 150–400)
Lab: 28 pg (ref 26–34)
Lab: 33 g/dL (ref 32.0–36.0)
Lab: 37 % — ABNORMAL LOW (ref >60)
Lab: 4.4 M/UL — ABNORMAL HIGH (ref <100)
Lab: 5.9 K/UL (ref >40)

## 2020-05-10 LAB — C REACTIVE PROTEIN (CRP): Lab: 0.6 mg/dL (ref <1.0)

## 2020-05-10 LAB — TSH WITH FREE T4 REFLEX: Lab: 1.2 uU/mL (ref 0.35–5.00)

## 2020-05-10 LAB — VITAMIN B12: Lab: 242 pg/mL (ref 180–914)

## 2020-05-10 LAB — 25-OH VITAMIN D (D2 + D3): Lab: 58 ng/mL (ref 30–80)

## 2020-05-10 LAB — SED RATE: Lab: 7 mm/h (ref 0–20)

## 2020-05-10 MED ORDER — CYANOCOBALAMIN (VITAMIN B-12) 1,000 MCG PO TAB
1000 ug | ORAL_TABLET | Freq: Every day | ORAL | 0 refills | 29.00000 days | Status: AC
Start: 2020-05-10 — End: ?

## 2020-05-10 NOTE — Progress Notes
Rheumatology Follow Up Visit   Benjamin Li is a 64 y.o. male who presents today for a follow up visit for seronegative rheumatoid arthritis. He also has a history of cirrhosis secondary to NASH, diverticulitis, gastric sleeve, neuromyotonia (Isaac's disease), and hypertension.    BRIEF SUMMARY:   Benjamin Li was referred to [facility] rheumatology in 2014 for concern for rheumatoid arthritis by his neurologist, Dr Patrcia Dolly. He presented with swelling of the hands, wrists, and knees. He was diagnosed as seronegative non erosive rheumatoid arthritis based on MRI findings 08/2012 of synovitis of the left wrist and hand as well as based on activite synovitis found on initial exam. He was started on a prednisone taper and was started on Sulfasalazine but was unable to tolerate it due to abdominal pain. He was then started on Hydroxychloroquine 200 mg twice a day. It had been advised by GI to avoid methotrexate after a liver biopsy which confirmed cirrhosis. He underwent a gastric sleeve surgery in 2014 and had marked weight loss. He started Simponi in 2015 due to persistent synovitis. He failed therapy with Simponi and was switched to Orencia in 2017. He also self discontinued Hydroxychloroquine in 2017 due to not finding it beneficial. By 05/2016 Benjamin Li was discontinued due to persistent synovitis and Infliximab 3 mg/kg every 8 weeks. Infliximab was increased to 10 mg/kg in March 2018. MRI of the right upper extremity in 2018 showed Osteitis or possible erosions involving multiple carpal bones, which may be seen with inflammatory arthropathy and tenosynovitis despite his therapy. Infliximab was discontinued on 11/11/2018 due to secondary failure and he was started on Rituximab 1,000 mg two weeks apart every 6 months. His first round was in March 2020 at [facility].   ?  He was also seen in the spine clinic for cervical and lumbar injections in the past. Cervical x rays from his last joint survey show degenerative disease. 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 He has not been able to try colchicine in the past due to his liver disease. Inflammatory markers have not been elevated.    At his appointment in July 2020 he had reported no improvement since starting rituximab but wanted to continue it for several more months. He was last seen 12/2019 and reported rituximab wearing off after 2 months and morning stiffness for 2-3 hours. He also had diffuse myofascial pain and states he had previously been diagnosed with fibromyalgia and diffuse muscle pain which he attributed to his neuromyotonia. He reports he has been on cymbalta, gabapentin, and lyrica in the past and nothing helped. He has not seen neurology for his neuromyotonia in about a year.  We discussed continuing rituximab and continuing topical treatment such as diclofenac gel, emu oil with gabapentin and amitriptyline.  We discussed fibromyalgia clinic with Dr. Radene Knee although he would like to think about this before we place referral.  Also discussed paraffin wax baths.    INTERVAL HISTORY:   Benjamin Li states he is doing okay.  He still has joint pain but says rituximab has worked the best out of all the therapies he has tried in the past.  He had his last infusion in April at [facility].  He is tolerating it well and denies recent infections.  He does have neck pain and occasional pain in his hands which is worse with activity or at  the end of the day.  He does admit to some generalized swelling and redness in his hands but denies warmth.  He admits to morning stiffness all over for 1 to 2 hours.  He also feels stiff in his muscles not only has joint pain but muscular pain as well.  He states he has lost about 30 pounds in the past 5 months.  He did have a gastric sleeve surgery years ago and he lost a lot of weight but his weight stable out and recently lost 30 pounds more.  He says he has not really been trying to lose weight.  He did cut out eating fudgesickles and maybe has been a little more active than he usually is.  We discussed that he likely also has fibromyalgia as he says he feels achy all over however he thinks he is doing okay and does not want to go to the fibromyalgia clinic at this time.  He is happy with his current treatment and would like to continue.  He is also not followed up with neurology for a while for his Benjamin Li.         REVIEW OF SYSTEMS:  Review of Systems  Constitutional: Positive for unexpected weight change.   Musculoskeletal: Positive for arthralgias, joint swelling, neck pain and neck stiffness.   All other systems reviewed and are negative.    MEDICATIONS:  ? AMITR/GABAPEN/EMU OIL 01/03/09% CREAM (COMPOUND) Apply topically to affected area three times daily as needed.   ? amitriptyline (ELAVIL) 50 mg tablet TAKE 1 TAB BY MOUTH AT BEDTIME DAILY.   ? diclofenac (VOLTAREN) 1 % topical gel Apply two g topically to affected area four times daily.   ? famciclovir (FAMVIR) 250 mg tablet Take 250 mg by mouth twice daily.   ? fentaNYL (DURAGESIC) 50 mcg/hr patch Apply 1 Patch to top of skin as directed every 72 hours   ? losartan(+) (COZAAR) 100 mg tablet Take 50 mg by mouth daily.   ? meloxicam (MOBIC) 15 mg tablet Take 15 mg by mouth daily.   ? naloxegol (MOVANTIK) 12.5 mg tab Take 12.5 mg by mouth daily.   ? omeprazole DR(+) (PRILOSEC) 20 mg capsule Take 40 mg by mouth daily.   ? oxyCODONE SR (OXYCONTIN) 10 mg tablet Take 10 mg by mouth every 12 hours   ? tiZANidine (ZANAFLEX) 4 mg tablet 2-4mg  in the evening PRN leg cramps   ? traZODone (DESYREL) 100 mg tablet Take 200 mg by mouth at bedtime daily.          PHYSICAL EXAM:  Vitals:    05/10/20 0924   BP: 126/69   BP Source: Arm, Left Upper   Patient Position: Sitting   Pulse: 56   Resp: 16   Temp: 36.6 ?C (97.9 ?F)   TempSrc: Oral   SpO2: 100% Weight: 80.6 kg (177 lb 9.6 oz)   Height: 177.8 cm (70)   PainSc: Five     body mass index is 25.48 kg/m?Marland Kitchen     Physical Exam  Vitals and nursing note reviewed.   Constitutional:       General: He is not in acute distress.     Appearance: Normal appearance. He is normal weight. He is not ill-appearing, toxic-appearing or diaphoretic.   HENT:      Head: Normocephalic and atraumatic.      Right Ear: External ear normal.      Left Ear: External ear normal.      Nose: Nose  normal.      Mouth/Throat:      Mouth: Mucous membranes are moist.      Pharynx: Oropharynx is clear. No oropharyngeal exudate or posterior oropharyngeal erythema.   Eyes:      General: No scleral icterus.        Right eye: No discharge.         Left eye: No discharge.      Extraocular Movements: Extraocular movements intact.      Conjunctiva/sclera: Conjunctivae normal.      Pupils: Pupils are equal, round, and reactive to light.   Cardiovascular:      Rate and Rhythm: Normal rate and regular rhythm.      Pulses: Normal pulses.      Heart sounds: No murmur. No friction rub. No gallop.    Pulmonary:      Effort: Pulmonary effort is normal. No respiratory distress.      Breath sounds: Normal breath sounds. No wheezing or rales.   Abdominal:      General: Abdomen is flat. Bowel sounds are normal. There is no distension.      Palpations: Abdomen is soft.      Tenderness: There is no abdominal tenderness. There is no guarding.   Musculoskeletal:         General: No swelling, deformity or signs of injury.      Right lower leg: No edema.      Left lower leg: No edema.      Comments: Diffuse myofascial tenderness, decreased extension and flexion of the wrists. Shoulder ROM limited with abduction and flexion. No swelling, redness of warmth of the joints but tenderness of the bilateral 2nd and 3rd MCPs. Heberden's nodes present.    Lymphadenopathy:      Cervical: No cervical adenopathy.   Skin:     General: Skin is warm and dry.      Coloration: Skin is not jaundiced.      Findings: No bruising or erythema.   Neurological:      General: No focal deficit present.      Mental Status: He is alert and oriented to person, place, and time.      Cranial Nerves: No cranial nerve deficit.      Sensory: No sensory deficit.      Motor: No weakness.      Gait: Gait normal.   Psychiatric:         Mood and Affect: Mood normal.         Thought Content: Thought content normal.           Clinical Disease Activity Index     Tender Jt Ct   (TJC): 2   Swollen Jt Ct  (SJC): 0   Visual Analogue Scale - Pt Global Assessment: 6   Visual Analogue Scale - Evaluator Global Assessment: 5   CDAI TOTAL SCORE: 13   CDAI Interpretation: >10.0-22  Mod Dis Activity          LABS:  CBC w/Diff    Lab Results   Component Value Date/Time    WBC 5.9 05/10/2020 11:41 AM    RBC 4.41 05/10/2020 11:41 AM    HGB 12.5 (L) 05/10/2020 11:41 AM    HCT 37.8 (L) 05/10/2020 11:41 AM    MCV 85.6 05/10/2020 11:41 AM    MCH 28.4 05/10/2020 11:41 AM    MCHC 33.2 05/10/2020 11:41 AM    RDW 14.0 05/10/2020 11:41 AM    PLTCT 232 05/10/2020 11:41  AM    MPV 9.7 05/10/2020 11:41 AM    Lab Results   Component Value Date/Time    NEUT 65 12/15/2019 10:22 AM    ANC 4.48 12/15/2019 10:22 AM    LYMA 23 (L) 12/15/2019 10:22 AM    ALC 1.54 12/15/2019 10:22 AM    MONA 7 12/15/2019 10:22 AM    AMC 0.48 12/15/2019 10:22 AM    EOSA 3 12/15/2019 10:22 AM    AEC 0.17 12/15/2019 10:22 AM    BASA 2 12/15/2019 10:22 AM    ABC 0.13 12/15/2019 10:22 AM        Comprehensive Metabolic Profile    Lab Results   Component Value Date/Time    NA 140 05/10/2020 11:41 AM    K 4.6 05/10/2020 11:41 AM    CL 103 05/10/2020 11:41 AM    CO2 29 05/10/2020 11:41 AM    GAP 8 05/10/2020 11:41 AM    BUN 15 05/10/2020 11:41 AM    CR 0.98 05/10/2020 11:41 AM    GLU 102 (H) 05/10/2020 11:41 AM    Lab Results   Component Value Date/Time    CA 9.5 05/10/2020 11:41 AM    ALBUMIN 4.1 05/10/2020 11:41 AM    TOTPROT 6.7 05/10/2020 11:41 AM    ALKPHOS 75 05/10/2020 11:41 AM AST 18 05/10/2020 11:41 AM    ALT 12 05/10/2020 11:41 AM    TOTBILI 0.6 05/10/2020 11:41 AM    GFR >60 05/10/2020 11:41 AM    GFRAA >60 05/10/2020 11:41 AM        IMAGING: Pertinent Imaging Reviewed    ASSESSMENT:   1. Seronegative rheumatoid arthritis (HCC)    2. Fibromyalgia    3. Encounter for monitoring rituximab therapy    4. Therapeutic drug monitoring    5. Polyarthralgia    6. Hepatic cirrhosis, unspecified hepatic cirrhosis type, unspecified whether ascites present (HCC)    7. Immunosuppressed status (HCC)    8. Benjamin Li        IMPRESSION: Benjamin Li is a 64 y.o. male who presents today for follow up on seronegative rheumatoid arthritis.   Also an MRI of his upper extremity showed possible erosions this was not seen on radiographs making this questionable.  Furthermore he has failed multiple DMARDs and biologics.  His pain is likely multifactorial as there is also some question of CPPD arthropathy from narrowing of the second and third MCP joints on radiographs.  Unfortunately he had no benefit from Plaquenil in the past and cannot tolerate colchicine due to his liver cirrhosis.  He also has osteoarthritis and appears to have fibromyalgia that is not well controlled.  He also has myotonia from [facility] which may be contributing to his muscle pain as well.  His CDAI score today shows moderate disease activity but I think this is driven up by his muscular pain and fibromyalgia.  Rituximab has seems to provide some benefit compared to his prior therapies and he is happy with this and would like to continue.      PLAN:   -Continue Rituximab 1,000 mg two weeks apart every 6 months   -Labs today as below.  -Joint survey today.  -Discussed fibromyalgia symptoms.  Recommended low impact exercises such as walking, swimming or tai chi.  Also discussed the importance of sleep.  -Continue Voltaren gel and gabapentin/emu/amit oil PRN.  -Discussed he may want to return to his neurologist for treatment of neuromyotonia which may be contributing to his  pain as well.   -Continue drug monitoring with CBC, CMP, CRP every 6 months to be drawn prior to his infusion.  -He is due for colonoscopy next year and recommended he discuss his significant weight loss with his primary doctor.  He states he has regular monitoring of his PSA levels which has been normal.  May consider repeat early colonoscopy considering his unintentional weight loss  -Prevnar 09/2014, Pneumovax 03/22/2015.  -Patient has received his Covid vaccine  -RTC in 4 months or sooner if any issues.    Patient seen and discussed with Dr. Thedore Mins A. Pollyann Kennedy D.O.  Rheumatology Fellow, PGY5  [facility]  Division of Rheumatology  [address]  [place], [place] [address]      ATTESTATION    I personally performed the key portions of the E/M visit, discussed the case with the Rheumatology fellow, Dr. Warrick Parisian, and concur with her documentation of history, physical exam, assessment, and treatment plan unless otherwise noted.    The patient feels that the rheumatoid arthritis has responded the best to rituximab that he has been on compared to his previous medications.  He has not had any swollen joints.  He has lost about 30 pounds in the past few months but he has been more active going to the [facility] house and has adjusted his diet reducing sugar intake.  He has been up-to-date on age-appropriate cancer screening studies and his next colonoscopy is scheduled for 2022.  He has not had any other concerns such as fevers or night sweats.  He does not have any active synovitis on exam today and makes full fist bilaterally with range of motion of the extremities which are within functional limits.  We will continue with current therapy and obtain laboratory studies.    I answered questions. Patient voiced understanding of the above discussion and agreement with plans.     Staff Name: Vladimir Crofts, MD     Orders Placed This Encounter ? JOINT SRVY 1 VW 2+ JOINTS   ? IMMUNOGLOBULINS-IGA,IGG,IGM today   ? 25-OH VITAMIN D (D2 + D3)  today   ? VITAMIN B12 level today   ? TSH WITH FREE T4 REFLEX today   ? CBC   ? COMPREHENSIVE METABOLIC PANEL  today   ? SED RATE today   ? C REACTIVE PROTEIN (CRP) today   ? ELECTROPHORESIS-SERUM PROTEIN today   ? IMMUNOFIXATION, SERUM (IFES)     Patient Instructions   It was so nice to see you today.  If any questions or concerns as well as review of results, please do not hesitate to use MyChart messages.  In addition, Irving Burton is the nurse I work with; her phone is [phone].        Please have blood work today    Please have x rays as well    Please notify your primary doctor or hepatologist about weight loss. Please follow up with primary doctor about your prostate antigen.    Continue rituximab      No future appointments.  Visit Disposition     Dispositions    ? Return in about 6 months (around 11/10/2020).

## 2020-05-11 ENCOUNTER — Encounter: Admit: 2020-05-11 | Discharge: 2020-05-11 | Payer: MEDICARE

## 2020-05-11 NOTE — Telephone Encounter
Pt returned call, advised of x-rays, lab results and recommendations. Pt verbalized understanding. Pt stated they have moved out of the area and would like a recommendation for a rheumatologist in their area.

## 2020-05-11 NOTE — Telephone Encounter
Contacted pt regarding lab results and x-rays. Left vm asking for return call.

## 2020-05-11 NOTE — Telephone Encounter
-----   Message from Candise Che, RN sent at 05/11/2020  8:26 AM CDT -----      ----- Message -----  From: Warrick Parisian, DO  Sent: 05/10/2020   5:44 PM CDT  To: Candise Che, RN    You have a normal vitamin D level and your inflammatory markers are not elevated.  Your vitamin B12 level is low normal and I think you would likely benefit from some vitamin B12 supplements which I will call into your pharmacy.  Your thyroid hormone was normal.  Immunoglobulins showed a very mildly decreased IgG level.  Blood glucose was just slightly elevated at 102 but your kidney and liver function are normal.  Your hemoglobin was also slightly low-yours was 12.5 with normal being 13.5 for a male.  It has been slightly low in the past as well.  I would like to continue to monitor this periodically.  Please let me know if you have any questions.    Dr. Pollyann Kennedy

## 2020-05-12 ENCOUNTER — Encounter: Admit: 2020-05-12 | Discharge: 2020-05-12 | Payer: MEDICARE

## 2020-07-01 ENCOUNTER — Encounter: Admit: 2020-07-01 | Discharge: 2020-07-01 | Payer: MEDICARE

## 2020-07-01 DIAGNOSIS — M0600 Rheumatoid arthritis without rheumatoid factor, unspecified site: Secondary | ICD-10-CM

## 2020-07-01 NOTE — Telephone Encounter
Rituxan order renewed.    Warrick Parisian   Rheumatology Fellow

## 2020-07-01 NOTE — Telephone Encounter
Received call from [facility] from [facility] outpatient infusion center. The patients Rituxan infusion order has expired. Routing order to Dr. Pollyann Kennedy for review.    Ph: [phone]  Fax: [phone]

## 2020-07-08 ENCOUNTER — Encounter: Admit: 2020-07-08 | Discharge: 2020-07-08 | Payer: MEDICARE

## 2020-07-28 ENCOUNTER — Encounter: Admit: 2020-07-28 | Discharge: 2020-07-28 | Payer: MEDICARE

## 2021-01-12 ENCOUNTER — Encounter: Admit: 2021-01-12 | Discharge: 2021-01-12 | Payer: MEDICARE

## 2021-01-26 ENCOUNTER — Encounter: Admit: 2021-01-26 | Discharge: 2021-01-26 | Payer: MEDICARE

## 2021-05-05 IMAGING — CR LOW_EXM
3 series · 3 of 3 positions shown · non-contrast
Comparison: none

[knee ap]
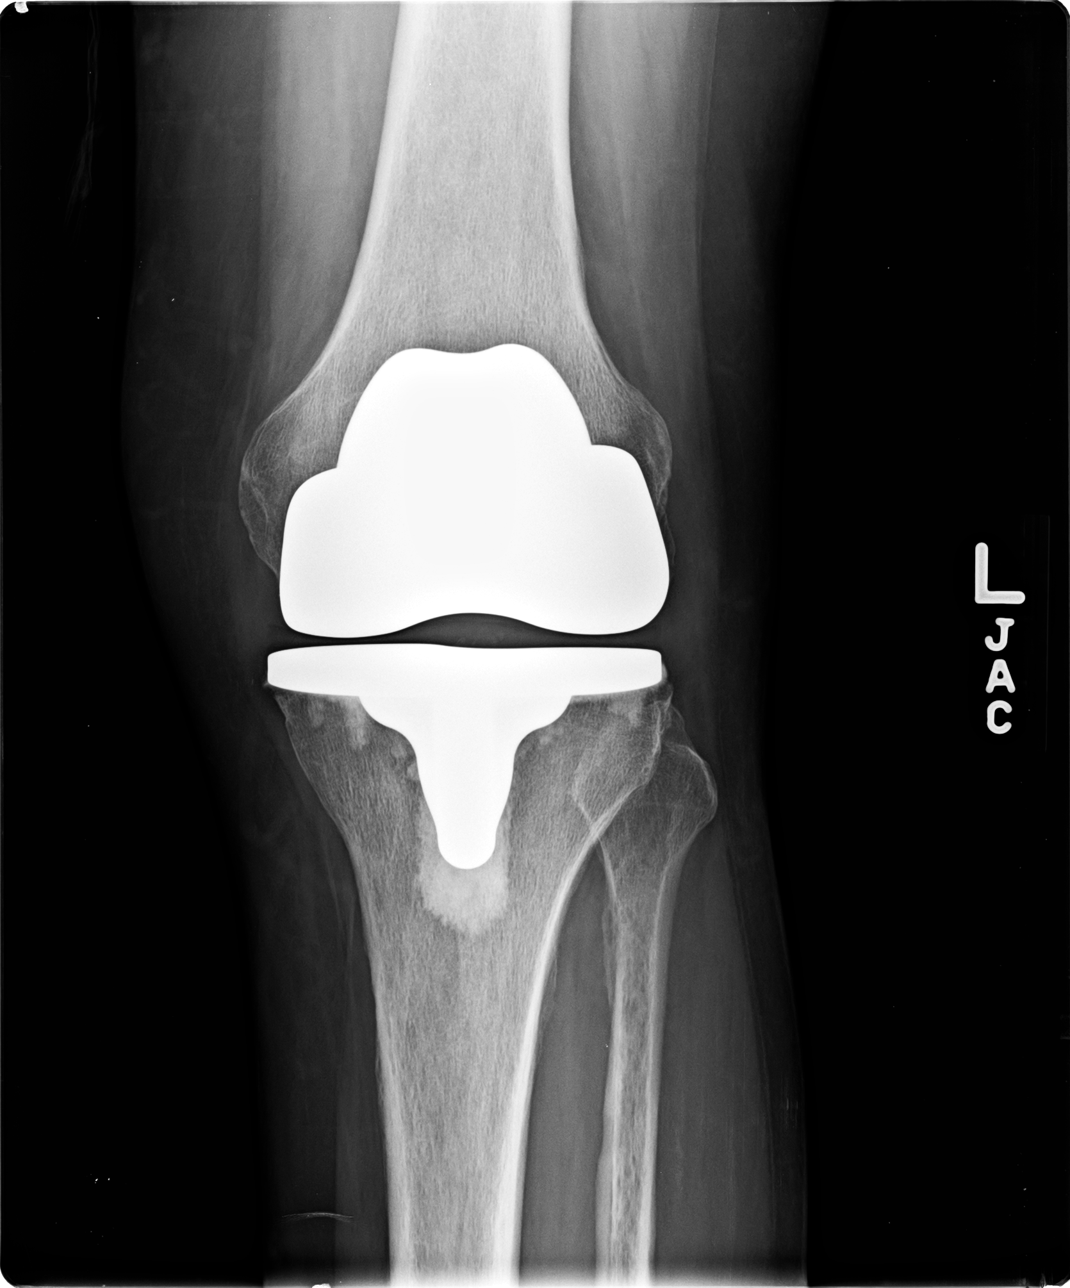

[knee sunrise]
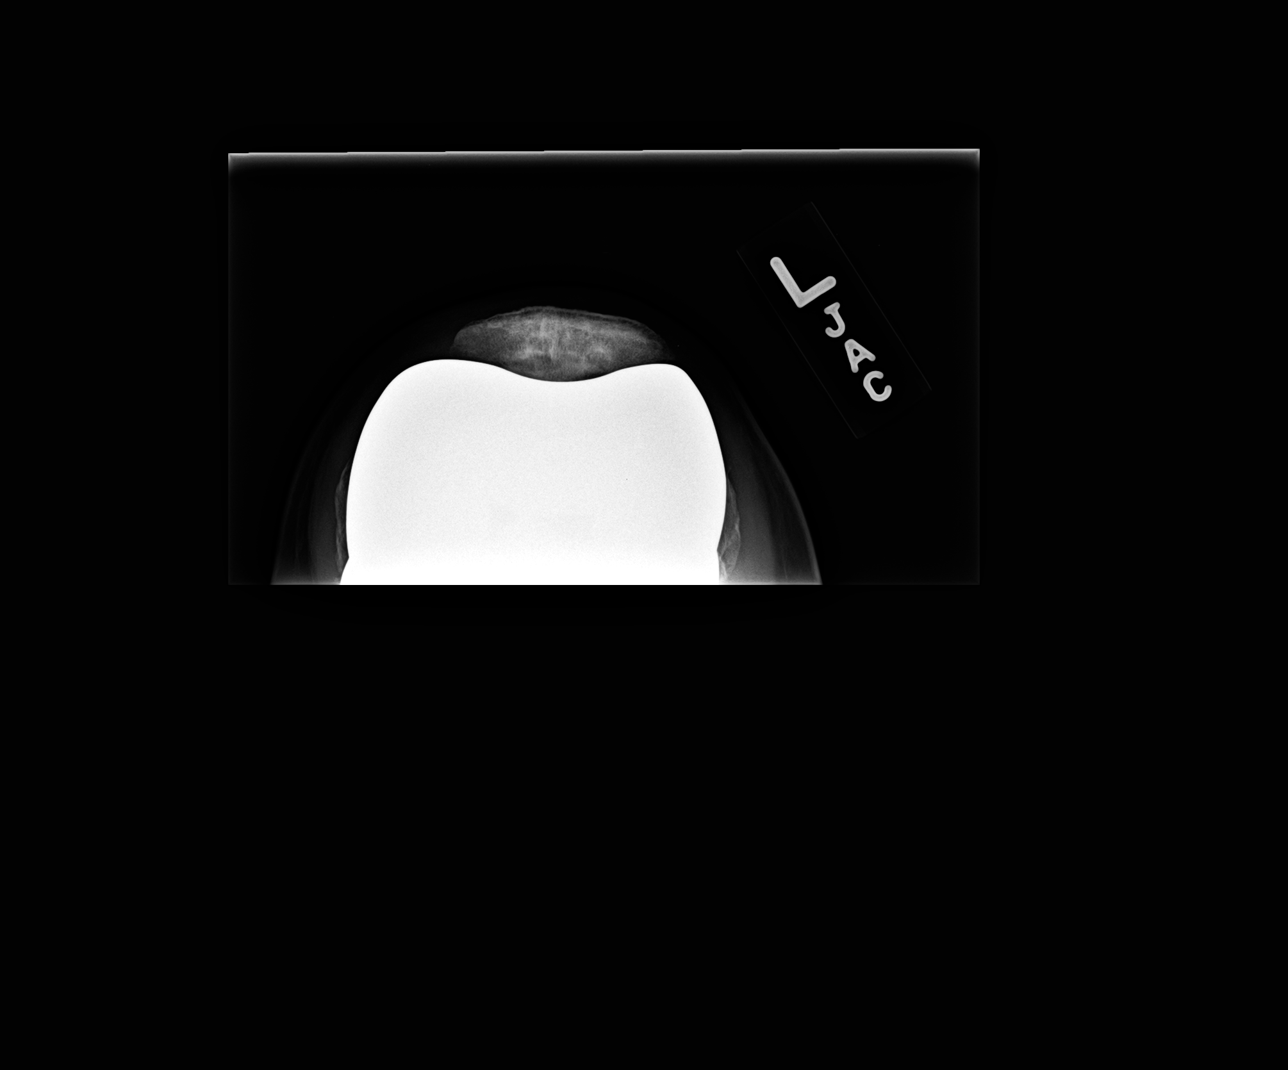

[knee lat]
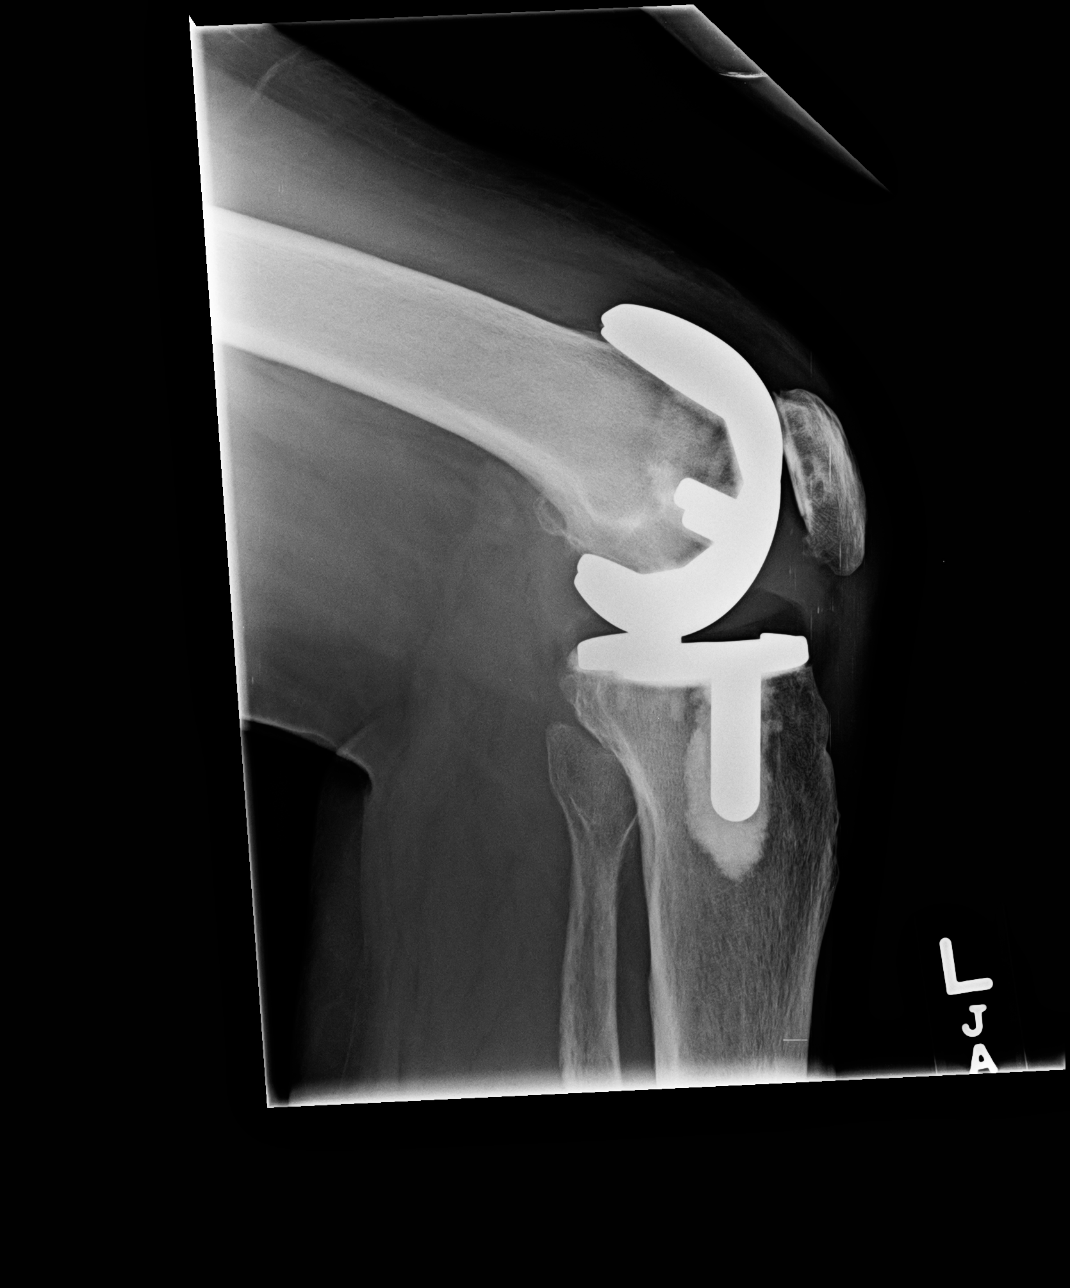

[3 of 3 positions shown; findings below may reference images not displayed]

EXAM

XR knee LT 3V

INDICATION

L knee pain s/p TKA
FU TKA 3 MONTHS AGO, CONTINUED, WORSENING PAIN AT THIS TIME.  ALL OVER.  UNABLE TO STRAIGHTEN KNEE.

TECHNIQUE

XR knee LT 3V

COMPARISONS

February 18, 2019

FINDINGS

Left total knee arthroplasty without evidence of hardware complication. Patellar resurfacing.
Normal osseous mineralization. Small amount of joint fluid. No fracture or dislocation.

IMPRESSION

Left total knee arthroplasty without evidence of hardware complication.

Tech Notes:

FU TKA 3 MONTHS AGO, CONTINUED, WORSENING PAIN AT THIS TIME.  ALL OVER.  UNABLE TO STRAIGHTEN KNEE.

## 2021-07-06 ENCOUNTER — Encounter: Admit: 2021-07-06 | Discharge: 2021-07-06 | Payer: MEDICARE

## 2021-07-06 DIAGNOSIS — M0600 Rheumatoid arthritis without rheumatoid factor, unspecified site: Secondary | ICD-10-CM

## 2021-07-06 NOTE — Telephone Encounter
Infusion order reviewed and signed by provider. Order routed to [facility] infusion center

## 2021-07-06 NOTE — Telephone Encounter
Received VM from [facility] infusion center requesting updated infusion order for Benjamin Li [phone]  F [phone]    Routing pended order for provider review

## 2021-07-13 ENCOUNTER — Encounter: Admit: 2021-07-13 | Discharge: 2021-07-13 | Payer: MEDICARE

## 2021-07-13 ENCOUNTER — Ambulatory Visit: Admit: 2021-07-13 | Discharge: 2021-07-13 | Payer: MEDICARE

## 2021-07-13 DIAGNOSIS — I10 Essential (primary) hypertension: Secondary | ICD-10-CM

## 2021-07-13 DIAGNOSIS — M0600 Rheumatoid arthritis without rheumatoid factor, unspecified site: Secondary | ICD-10-CM

## 2021-07-13 DIAGNOSIS — M2550 Pain in unspecified joint: Secondary | ICD-10-CM

## 2021-07-13 DIAGNOSIS — M62838 Other muscle spasm: Secondary | ICD-10-CM

## 2021-07-13 DIAGNOSIS — M797 Fibromyalgia: Secondary | ICD-10-CM

## 2021-07-13 DIAGNOSIS — K5792 Diverticulitis of intestine, part unspecified, without perforation or abscess without bleeding: Secondary | ICD-10-CM

## 2021-07-13 DIAGNOSIS — R519 Generalized headaches: Secondary | ICD-10-CM

## 2021-07-13 DIAGNOSIS — Z5181 Encounter for therapeutic drug level monitoring: Secondary | ICD-10-CM

## 2021-07-13 DIAGNOSIS — D849 Immunodeficiency, unspecified: Secondary | ICD-10-CM

## 2021-07-13 DIAGNOSIS — K7460 Unspecified cirrhosis of liver: Secondary | ICD-10-CM

## 2021-07-13 NOTE — Patient Instructions
It was so nice to see you today. If there are any questions or concerns, as well as review of results, please do not hesitate to use MyChart messages. In addition, Marcos is the nurse I work with; his phone number is [phone].

## 2021-07-14 ENCOUNTER — Encounter: Admit: 2021-07-14 | Discharge: 2021-07-14 | Payer: MEDICARE

## 2021-07-21 ENCOUNTER — Encounter: Admit: 2021-07-21 | Discharge: 2021-07-21 | Payer: MEDICARE

## 2021-07-22 ENCOUNTER — Encounter: Admit: 2021-07-22 | Discharge: 2021-07-22 | Payer: MEDICARE

## 2021-07-27 ENCOUNTER — Encounter: Admit: 2021-07-27 | Discharge: 2021-07-27 | Payer: MEDICARE

## 2021-07-27 DIAGNOSIS — Z5181 Encounter for therapeutic drug level monitoring: Secondary | ICD-10-CM

## 2022-01-10 ENCOUNTER — Encounter: Admit: 2022-01-10 | Discharge: 2022-01-10 | Payer: MEDICARE

## 2022-01-10 DIAGNOSIS — M0600 Rheumatoid arthritis without rheumatoid factor, unspecified site: Secondary | ICD-10-CM

## 2022-01-10 NOTE — Telephone Encounter
Received VM from [facility] infusion clinic stating that formulary changed and is requiring Ruxience in place of Rituxan   Routing to provider for review.

## 2022-01-11 ENCOUNTER — Encounter: Admit: 2022-01-11 | Discharge: 2022-01-11 | Payer: MEDICARE

## 2022-02-15 ENCOUNTER — Encounter: Admit: 2022-02-15 | Discharge: 2022-02-15 | Payer: MEDICARE

## 2022-02-16 ENCOUNTER — Encounter: Admit: 2022-02-16 | Discharge: 2022-02-16 | Payer: MEDICARE

## 2022-04-02 NOTE — Progress Notes
Rheumatology Follow Up Visit   Benjamin Li is a 66 y.o. male who presents today for a follow up visit for seronegative rheumatoid arthritis. He also has a history of cirrhosis secondary to NASH, diverticulitis, gastric sleeve,?neuromyotonia (Isaac's disease),?and hypertension.    BRIEF SUMMARY:   Benjamin Li was referred to [facility] rheumatology in 2014 for concern for rheumatoid arthritis by his neurologist, Dr Patrcia Dolly. He presented with swelling of the hands, wrists, and knees. He was diagnosed as seronegative non erosive rheumatoid arthritis based on MRI findings 08/2012 of synovitis of the left wrist and hand as well as based on activite synovitis found on initial exam. He was started on a prednisone taper and was started on?Sulfasalazine?but was unable to tolerate it due to abdominal pain. He was then started on?Hydroxychloroquine?200 mg twice a day. It had been advised by GI to avoid methotrexate after a liver biopsy which confirmed cirrhosis. He underwent a gastric sleeve surgery in 2014 and had marked weight loss. He started Simponi in 2015 due to persistent synovitis. He failed therapy with Simponi and was switched to Orencia in 2017. He also self discontinued?Hydroxychloroquine?in 2017 due to not finding it beneficial. By 05/2016 Benjamin Li was discontinued due to persistent synovitis and Infliximab 3 mg/kg every 8 weeks. Infliximab was increased to 10 mg/kg in March 2018. MRI of the right upper extremity in 2018 showed Osteitis or possible erosions involving multiple carpal bones, which may be seen with inflammatory arthropathy and tenosynovitis despite his therapy. Infliximab was discontinued on 11/11/2018 due to secondary failure and he was started on Rituximab?1,000 mg two weeks apart every 6 months.?His first round was in?March?2020?at [facility].   ?  He was also seen in the spine clinic for cervical and lumbar injections in the past. Cervical x rays from his last joint survey show degenerative disease. 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 He has not been able to try colchicine in the past due to his liver disease. Inflammatory markers have not been elevated.  ?  At his appointment in July 2020 he had reported no improvement since starting rituximab but wanted to continue it for several more months. He was last seen 12/2019 and reported rituximab wearing off after 2 months and morning stiffness for 2-3 hours. He also had diffuse myofascial pain and states he had previously been diagnosed with fibromyalgia and diffuse muscle pain which he attributed to his neuromyotonia. He reports he has been on Cymbalta, gabapentin, and Lyrica in the past and nothing helped. He has not seen neurology for his neuromyotonia in about a year. ?We discussed continuing rituximab and continuing topical treatment such as diclofenac gel, emu oil with gabapentin and amitriptyline. We discussed fibromyalgia clinic with Dr. Radene Knee although he would like to think about this before we place referral. ?Also discussed paraffin wax baths.  ?    INTERVAL HISTORY:   Patient was last seen 07/13/2021. He still has good and bad days. Fingers bother him the most. He states no particular time of day that his joints bother him. He states the morning and evening are the same. He notes if he works on things more, his hands and back bother him him, lower back which is chronic. He was told his right knee needs to be replaced but did not want to do it. He had is left knee replaced about 3 years ago. He uses a fentanyl  patch and oxycodone at home for pain. He is not able to take ibuprofen or tylenol. He is to see his hepatologist at [facility] this Friday. His last infusions were 4/7 and 4/21 and due again in 07/2022. He states morning stiffness for 15-30 minutes. He states RTX has been helpful.    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    Denied recent fevers, falls/injuries, skin changes, eye changes, chest pain or difficulty breathing.    Current medications:  -Rituximab infusions every 6 months-started March 2020 till current.  ?  Prior medications:  -Sulfasalazine-initiated in 2014 but discontinued due to intolerance from abdominal pain  -Hydroxychloroquine-initiated in 2017 and self discontinued due to loss of benefit.  -Simponi- initiated 2015 due to persistent synovitis but failed therapy.  -Orencia-initiated 2017 and discontinued 05/2016 due to persistent synovitis  -Infliximab-initiated in 2017, increased the dose in March 2018 but was discontinued 11/11/2018 due to secondary failure   ?  Serology/labs:  2021: ESR 7, CRP 0.65, SPEP normal electrophoretic pattern no paraproteinemia seen  2018: Negative ANA, rheumatoid factor, and CCP IgG antibodies  2016: Urine protein creatinine ratio negative  2015: Aldolase negative, negative Jo 1 antibody.  2014: Negative antimitochondrial antibody, anti-smooth muscle antibody  ?  Imaging:  ?  US Abdomen 02/06/2022  No evidence of hepatic mass or biliary ductal dilatation. Coarse hepatic echogenicity with increased echotexture is similar to the prior examination.    Joint survey 05/10/2020  1. Arthritic changes involving the MCP joints is again noted which could   reflect CPPD arthropathy. Chondrocalcinosis is not appreciated.   2. Changes involving both wrists consistent with early SLAC arthropathy.   3. Degenerative changes involving both hips have progressed slightly since the study of 04/16/2017.   ?  MRI right upper extremity with and without contrast 06/05/2017  1. Osteitis or possible erosions involving multiple carpal bones as above,   which may be seen with inflammatory arthropathy.   2. Tendinosis and tenosynovitis within the second and third extensor   compartments at the level of Lister's tubercle and the radiocarpal joint.   ?  Joint survey 04/16/2017  1. No osseous erosions.   2. Progression of arthritic changes as above.    ?  Bilateral wrist x-rays 09/23/2015  1. No significant change in marked left radial scaphoid arthrosis and   moderate right radial scaphoid arthrosis consistent with bilateral SLAC arthropathy.   2. Mild bilateral first CMC and triscaphe joint osteoarthritis.   3. Unchanged mild left scapholunate diastasis which may reflect scapholunate dissociation.  ?  MRI cervical spine without contrast 07/31/2014  1. Mild central spinal stenosis at the C4-5 and C5-6 levels.   2. Moderate bilateral neural foraminal encroachment at C5-6 and C6-7 levels   ?    REVIEW OF SYSTEMS:  Review of Systems  Musculoskeletal: Positive for arthralgias, back pain, joint swelling, myalgias, neck pain and neck stiffness.   All other systems reviewed and are negative.  ?  MEDICATIONS:  ? AMITR/GABAPEN/EMU OIL 01/03/09% CREAM (COMPOUND) Apply topically to affected area three times daily as needed.   ? amitriptyline (ELAVIL) 50 mg tablet TAKE 1 TAB BY MOUTH AT BEDTIME DAILY.   ? cyanocobalamin 1,000 mcg tablet Take one tablet by mouth daily.   ? diclofenac (VOLTAREN) 1 % topical gel Apply two g topically to affected area four times daily.   ? famciclovir (FAMVIR) 250 mg tablet Take one tablet by mouth twice daily.   ? fentaNYL (DURAGESIC)  50 mcg/hr patch Apply 1 Patch to top of skin as directed every 72 hours   ? LINZESS 72 mcg capsule Take one capsule by mouth daily.   ? losartan(+) (COZAAR) 100 mg tablet Take one-half tablet by mouth daily. Indications: take 50 mg by mouth daily.   ? meloxicam (MOBIC) 15 mg tablet Take one tablet by mouth daily.   ? naloxegoL (MOVANTIK) 12.5 mg tablet Take one tablet by mouth daily.   ? omeprazole DR(+) (PRILOSEC) 20 mg capsule Take two capsules by mouth daily.   ? oxyCODONE SR (OXYCONTIN) 10 mg tablet Take one tablet by mouth every 12 hours.   ? tiZANidine (ZANAFLEX) 4 mg tablet 2-4mg  in the evening PRN leg cramps   ? traZODone (DESYREL) 100 mg tablet Take two tablets by mouth at bedtime daily.          PHYSICAL EXAM:  Vitals:    04/05/22 1228   BP: 115/75   BP Source: Arm, Right Upper   Pulse: 86   Temp: 36.4 ?C (97.5 ?F)   Resp: 20   SpO2: 98%   PainSc: Six   Weight: 81.4 kg (179 lb 6.4 oz)   Height: 177.8 cm (5' 10)     body mass index is 25.74 kg/m?Marland Kitchen     Physical Exam  Vitals reviewed.   Constitutional:       Appearance: Normal appearance.   HENT:      Head: Normocephalic and atraumatic.      Nose: Nose normal.      Mouth/Throat:      Pharynx: Oropharynx is clear.   Eyes:      Conjunctiva/sclera: Conjunctivae normal.   Cardiovascular:      Rate and Rhythm: Normal rate and regular rhythm.      Pulses: Normal pulses.      Heart sounds: Normal heart sounds.   Pulmonary:      Effort: Pulmonary effort is normal.   Abdominal:      General: Bowel sounds are normal.      Palpations: Abdomen is soft.   Musculoskeletal:      Comments: Able to form b/l fists. No synovitis noted. Mild flexion deformity noted on left elbow. No joint deformities, or red, warm, swollen joints noted. Tenderness to palpation of b/l wrists, 2nd and 3rd MCPs.   Skin:     General: Skin is warm.   Neurological:      General: No focal deficit present.      Mental Status: He is alert.   Psychiatric:         Mood and Affect: Mood normal.         Behavior: Behavior normal.                 Clinical Disease Activity Index    Tender (DAS-28): 8 / 28   Swollen (DAS-28): 0 / 28   Patient Global: 6 mm   Provider Global: 5 mm    CDAI: 9.1  Low Activity    LABS:  CBC w/Diff    Lab Results   Component Value Date/Time    WBC 5.9 05/10/2020 11:41 AM    RBC 4.41 05/10/2020 11:41 AM    HGB 12.5 (L) 05/10/2020 11:41 AM    HCT 37.8 (L) 05/10/2020 11:41 AM    MCV 85.6 05/10/2020 11:41 AM    MCH 28.4 05/10/2020 11:41 AM    MCHC 33.2 05/10/2020 11:41 AM    RDW 14.0 05/10/2020 11:41 AM    PLTCT  232 05/10/2020 11:41 AM    MPV 9.7 05/10/2020 11:41 AM    Lab Results   Component Value Date/Time    NEUT 65 12/15/2019 10:22 AM    ANC 4.48 12/15/2019 10:22 AM    LYMA 23 (L) 12/15/2019 10:22 AM    ALC 1.54 12/15/2019 10:22 AM    MONA 7 12/15/2019 10:22 AM    AMC 0.48 12/15/2019 10:22 AM    EOSA 3 12/15/2019 10:22 AM    AEC 0.17 12/15/2019 10:22 AM    BASA 2 12/15/2019 10:22 AM    ABC 0.13 12/15/2019 10:22 AM        Comprehensive Metabolic Profile    Lab Results   Component Value Date/Time    NA 140 05/10/2020 11:41 AM    K 4.6 05/10/2020 11:41 AM    CL 103 05/10/2020 11:41 AM    CO2 29 05/10/2020 11:41 AM    GAP 8 05/10/2020 11:41 AM    BUN 15 05/10/2020 11:41 AM    CR 0.98 05/10/2020 11:41 AM    GLU 102 (H) 05/10/2020 11:41 AM    Lab Results   Component Value Date/Time    CA 9.5 05/10/2020 11:41 AM    ALBUMIN 4.1 05/10/2020 11:41 AM    TOTPROT 6.7 05/10/2020 11:41 AM    ALKPHOS 75 05/10/2020 11:41 AM    AST 18 05/10/2020 11:41 AM    ALT 12 05/10/2020 11:41 AM    TOTBILI 0.6 05/10/2020 11:41 AM    GFR >60 05/10/2020 11:41 AM    GFRAA >60 05/10/2020 11:41 AM      IMAGING: Pertinent Imaging Reviewed    ASSESSMENT:   1. Seronegative rheumatoid arthritis (HCC)    2. Therapeutic drug monitoring    3. Encounter for monitoring rituximab therapy    4. Polyarthralgia    5. Immunosuppressed status (HCC)    6. Hepatic cirrhosis, unspecified hepatic cirrhosis type, unspecified whether ascites present [facility])        IMPRESSION: Benjamin Li is a 66 y.o. male who presents today for follow up on seronegative rheumatoid arthritis. He previously had an MRI of his upper extremity which showed possible erosions however not seen on radiographs making this future questionable. He has failed multiple DMARDs and biologics in the past. It was deemed his inflammatory arthropathy was multifactorial in nature as there is also some question of CPPD arthropathy from narrowing of the second and third MCP joints on radiographs. He unfortunately had no benefit from Plaquenil in the past and cannot tolerate colchicine due to liver cirrhosis. His CDAI score today shows low disease activity. He is tolerating rituximab well, and seems to provide some benefit compared to his prior therapies, currently states morning stiffness lasts 15 to 30 minutes which is improvement prior to rituximab infusions.   On his last visit we did discuss the possibility of changing the frequency of his rituximab 1000 mg every 4 months, however he states does not feel he needs to do this at this time.   Patient does also endorse osteoarthritic symptoms.  He does have pain in his hands after overuse.  We did discuss use of arthritic gloves, Voltaren gel up to 4 times a day as needed, and paraffin wax baths.    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  He states that he is taking oxycodone and using a fentanyl patch for this pain, prescribed by his PCP.  ?  PLAN:   -Continue rituximab 1000 mg day 1 and day 15 every 6 months (last infusion was 01/05/2022 and next infusion 07/2022)  -Patient may call or message if worsening arthralgia this winter, if he wants to change his rituximab 1000 mg infusion frequency to every 4 months (this would be 4 months from 07/22/2021)   -Continue Voltaren gel and gabapentin/emu/amit oil PRN; discussed using this more frequently, up to 4 times per day for Voltaren gel  -Currently using fentanyl patch and oxycodone per his PCP for neuromyotonia  -Hepatitis B core antibody total - 07/2021  -Continue drug monitoring with CBC, CMP, CRP, ESR, IgG every 6 months?to be drawn prior to his infusion.  We will also consider adding IgM and IgA globulins to his next infusion.  -RTC in 6 months or sooner if any issues.?      Patient seen and discussed with Dr. Dorathy Daft, MD  Rheumatology Fellow, PGY4  [facility]  Division of Rheumatology  [address]  [place], [place] [address]     Patient Instructions   It was so nice to see you today. If there are any questions or concerns, as well as review of results, please do not hesitate to use MyChart messages. In addition, Bradly Chris is the nurse I work with; his phone number is [phone].        Future Appointments   Date Time Provider Department Center   04/05/2022  1:30 PM Verlin Fester, MBBS [facility] IM     Visit Disposition     Dispositions    ? Return in about 6 months (around 10/06/2022).

## 2022-04-04 ENCOUNTER — Encounter: Admit: 2022-04-04 | Discharge: 2022-04-04 | Payer: MEDICARE

## 2022-04-05 ENCOUNTER — Encounter: Admit: 2022-04-05 | Discharge: 2022-04-05 | Payer: MEDICARE

## 2022-04-05 ENCOUNTER — Ambulatory Visit: Admit: 2022-04-05 | Discharge: 2022-04-06 | Payer: MEDICARE

## 2022-04-05 DIAGNOSIS — M2550 Pain in unspecified joint: Secondary | ICD-10-CM

## 2022-04-05 DIAGNOSIS — M62838 Other muscle spasm: Secondary | ICD-10-CM

## 2022-04-05 DIAGNOSIS — I10 Essential (primary) hypertension: Secondary | ICD-10-CM

## 2022-04-05 DIAGNOSIS — M0600 Rheumatoid arthritis without rheumatoid factor, unspecified site: Secondary | ICD-10-CM

## 2022-04-05 DIAGNOSIS — D849 Immunodeficiency, unspecified: Secondary | ICD-10-CM

## 2022-04-05 DIAGNOSIS — R519 Generalized headaches: Secondary | ICD-10-CM

## 2022-04-05 DIAGNOSIS — M797 Fibromyalgia: Secondary | ICD-10-CM

## 2022-04-05 DIAGNOSIS — K5792 Diverticulitis of intestine, part unspecified, without perforation or abscess without bleeding: Secondary | ICD-10-CM

## 2022-04-05 DIAGNOSIS — Z5181 Encounter for therapeutic drug level monitoring: Secondary | ICD-10-CM

## 2022-04-05 DIAGNOSIS — K7460 Unspecified cirrhosis of liver: Secondary | ICD-10-CM

## 2022-04-05 NOTE — Patient Instructions
It was so nice to see you today. If there are any questions or concerns, as well as review of results, please do not hesitate to use MyChart messages. In addition, Marcos is the nurse I work with; his phone number is [phone].

## 2022-04-26 ENCOUNTER — Encounter: Admit: 2022-04-26 | Discharge: 2022-04-26 | Payer: MEDICARE

## 2022-07-12 ENCOUNTER — Encounter: Admit: 2022-07-12 | Discharge: 2022-07-12 | Payer: MEDICARE

## 2022-07-13 ENCOUNTER — Encounter: Admit: 2022-07-13 | Discharge: 2022-07-13 | Payer: MEDICARE

## 2022-07-21 ENCOUNTER — Encounter: Admit: 2022-07-21 | Discharge: 2022-07-21 | Payer: MEDICARE

## 2022-07-21 DIAGNOSIS — M0609 Rheumatoid arthritis without rheumatoid factor, multiple sites: Secondary | ICD-10-CM

## 2022-07-21 DIAGNOSIS — M0600 Rheumatoid arthritis without rheumatoid factor, unspecified site: Secondary | ICD-10-CM

## 2022-07-21 NOTE — Telephone Encounter
VM received from external infusion clinic requesting updated DX code. Insurance will only accept DX code of M06.09.  Infusion order updated

## 2022-07-21 NOTE — Telephone Encounter
Updated infusion order with proper diagnosis code faxed to Dionne Milo at [phone]

## 2022-07-26 ENCOUNTER — Encounter: Admit: 2022-07-26 | Discharge: 2022-07-26 | Payer: MEDICARE

## 2022-11-23 ENCOUNTER — Encounter: Admit: 2022-11-23 | Discharge: 2022-11-23 | Payer: MEDICARE

## 2022-11-29 ENCOUNTER — Encounter: Admit: 2022-11-29 | Discharge: 2022-11-29 | Payer: MEDICARE

## 2023-05-03 ENCOUNTER — Encounter: Admit: 2023-05-03 | Discharge: 2023-05-03 | Payer: MEDICARE

## 2023-05-04 ENCOUNTER — Encounter: Admit: 2023-05-04 | Discharge: 2023-05-04 | Payer: MEDICARE

## 2023-05-21 ENCOUNTER — Encounter: Admit: 2023-05-21 | Discharge: 2023-05-21 | Payer: MEDICARE
